# Patient Record
Sex: Female | Born: 1985 | Race: White | Hispanic: No | Marital: Single | Smoking: Never smoker
Health system: Southern US, Community
[De-identification: ages and names within clinical notes are randomized; demographics above are authoritative.]

## PROBLEM LIST (undated history)

## (undated) DIAGNOSIS — O24419 Gestational diabetes mellitus in pregnancy, unspecified control: Secondary | ICD-10-CM

## (undated) DIAGNOSIS — I10 Essential (primary) hypertension: Secondary | ICD-10-CM

## (undated) HISTORY — PX: CHOLECYSTECTOMY: SHX55

## (undated) HISTORY — DX: Gestational diabetes mellitus in pregnancy, unspecified control: O24.419

## (undated) HISTORY — DX: Essential (primary) hypertension: I10

## (undated) HISTORY — PX: COLPOSCOPY: SHX161

---
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
## 2006-03-01 ENCOUNTER — Emergency Department (HOSPITAL_COMMUNITY): Admission: EM | Admit: 2006-03-01 | Discharge: 2006-03-01 | Payer: Self-pay | Admitting: Emergency Medicine

## 2006-12-10 ENCOUNTER — Inpatient Hospital Stay (HOSPITAL_COMMUNITY): Admission: AD | Admit: 2006-12-10 | Discharge: 2006-12-10 | Payer: Self-pay | Admitting: Obstetrics and Gynecology

## 2006-12-22 ENCOUNTER — Inpatient Hospital Stay (HOSPITAL_COMMUNITY): Admission: AD | Admit: 2006-12-22 | Discharge: 2006-12-22 | Payer: Self-pay | Admitting: Obstetrics and Gynecology

## 2007-02-23 ENCOUNTER — Inpatient Hospital Stay (HOSPITAL_COMMUNITY): Admission: AD | Admit: 2007-02-23 | Discharge: 2007-02-24 | Payer: Self-pay | Admitting: Obstetrics and Gynecology

## 2007-08-03 ENCOUNTER — Inpatient Hospital Stay (HOSPITAL_COMMUNITY): Admission: AD | Admit: 2007-08-03 | Discharge: 2007-08-06 | Payer: Self-pay | Admitting: Obstetrics and Gynecology

## 2007-10-25 ENCOUNTER — Emergency Department (HOSPITAL_COMMUNITY): Admission: EM | Admit: 2007-10-25 | Discharge: 2007-10-25 | Payer: Self-pay | Admitting: Family Medicine

## 2008-04-09 ENCOUNTER — Emergency Department
Admission: EM | Admit: 2008-04-09 | Disposition: A | Discharge status: Home / Self Care | Payer: Self-pay | Source: Ambulatory Visit

## 2008-06-07 ENCOUNTER — Observation Stay
Admission: EM | Admit: 2008-06-07 | Disposition: A | Discharge status: Home / Self Care | Payer: Self-pay | Source: Ambulatory Visit

## 2008-09-09 ENCOUNTER — Emergency Department
Admission: EM | Admit: 2008-09-09 | Disposition: A | Discharge status: Home / Self Care | Payer: Self-pay | Source: Ambulatory Visit

## 2008-11-27 ENCOUNTER — Ambulatory Visit
Admission: RE | Admit: 2008-11-27 | Disposition: A | Discharge status: Home / Self Care | Payer: Self-pay | Source: Ambulatory Visit

## 2010-09-30 ENCOUNTER — Emergency Department
Admission: EM | Admit: 2010-09-30 | Disposition: A | Discharge status: Home / Self Care | Payer: Self-pay | Source: Ambulatory Visit

## 2011-05-02 NOTE — H&P (Signed)
Emily [name], DOYLE                 ACCOUNT NO.:  [id number]   MEDICAL RECORD NO.:  [id number]          PATIENT TYPE:  MAT   LOCATION:  MATC                          FACILITY:  [facility]   PHYSICIAN:  Osborn Coho, M.D.   DATE OF BIRTH:  10-Feb-1986   DATE OF ADMISSION:  08/03/2007  DATE OF DISCHARGE:                              HISTORY & PHYSICAL   Ms. Schlatter is a 25 year old gravida 1, para 0, at 39-2/7 weeks, who  presented complaining of spontaneous rupture of membranes at 10 a.m.,  leaking clear fluid but no uterine contractions noted.  She reports  positive fetal movement.  Her group B strep culture is negative.  Pregnancy has been remarkable for:   1. Late to care at 17 weeks.  2. Family history of diabetes.  3. History of UTI, first trimester.  4. Rubella nonimmune.   PRENATAL LABS:  Blood type is O positive, Rh antibody.  VDRL  nonreactive.  Rubella titer is nonimmune.  Hepatitis B surface antigen  negative.  HIV nonreactive.  Sickle cell test was negative.  GC and  chlamydia cultures were negative in the first trimester.  Pap was  normal.  Cystic fibrosis testing was negative.  Hemoglobin  electrophoresis was normal.  Hemoglobin A1c was 4.8.  Hemoglobin upon  entering the practice was 12.  It was within normal limits at 27 weeks.  Glucola was normal at 113.  Quadruple screen was normal.  Group B strep  culture was negative at 36 weeks.  GC and chlamydia cultures were also  negative.   HISTORY OF PRESENT PREGNANCY:  The patient entered care at approximately  16-17 weeks.  Hemoglobin A1c was done as there was a very strong family  history of diabetes, both type 1 and type 2.  She had an ultrasound at  19 weeks showing normal growth and development with cervix of normal  length and anterior placenta.  Glucola was normal.  She had some severe  insomnia at 33 weeks and was given Ambien.  GC, chlamydia and group B  strep culture were done at 36 weeks, which were normal.  The  cervix was  2 cm in the office earlier this week.   OBSTETRICAL HISTORY:  The patient is a primigravida.   MEDICAL HISTORY:  She had her last Pap in 2006 prior to pregnancy and it  was normal.  She was a previous condom user.  She reports the usual  childhood illnesses.  She had a history of bladder infection in the past  and was given antibiotics at [facility] prior to her first visit.   She has no known medication allergies.   FAMILY HISTORY:  Maternal aunt, chronic hypertension.  Brother had  asthma.  Mother is a type 2 diabetic.  Her father and sister have type 1  diabetes.  Her sister had juvenile-onset diabetes and paternal  grandparents and maternal grandparents also had adult-onset diabetes.  Paternal aunt had some type of cancer.  Mother has depression and is on  medication.  Her father has depression and is on medication.   GENETIC HISTORY:  Remarkable for the father of the baby's paternal aunt  having six fingers and twins do run on the paternal side.   SOCIAL HISTORY:  The patient is single.  Father of the baby is involved  and supportive.  His name is Ned Grace.  The patient has an 11th  grade education.  She is employed at a [employer].  Her partner  has his GED and is going to community college.  He is employed as a  Curator.  She has been followed by the certified nurse midwife service  at [facility].  She denies any alcohol, drug or tobacco use  during this pregnancy.  She is biracial, white and Hispanic.   PHYSICAL EXAMINATION:  Vital signs are stable.  The patient is afebrile.  HEENT:  Within normal limits.  LUNGS:  Bilateral breath sounds are clear.  HEART:  Regular rate and rhythm without murmur.  BREASTS:  Soft and nontender.  ABDOMEN:  The fundal height is approximately 39 cm.  Estimated fetal  weight is 7-8 pounds.  Uterine contractions are very mild and  approximately every 5 minutes.  The patient is unaware of these at  present.  The  patient is noted to be leaking clear fluid.  Cervix is slightly  posterior, 2 cm, 75%, vertex at a -1 station.  Fetal heart rate is  reactive with no decelerations.  EXTREMITIES:  Deep tendon reflexes are 2+ without clonus.  There is a  trace edema noted.   IMPRESSION:  1. Intrauterine pregnancy at 39-2/7 weeks.  2. Early labor with spontaneous rupture of membranes and clear fluid.  3. Group B strep negative.   PLAN:  1. Admit to birthing suite per consult with Dr. Su Hilt as attending      physician.  2. Routine certified nurse midwife orders.  3. Will observe at present.  Will augment p.r.n. after 3-4 hours if no      advancement in labor.  4. Pain medications p.r.n.      Renaldo Reel Emilee Hero, C.N.M.      Osborn Coho, M.D.  Electronically Signed    VLL/MEDQ  D:  08/03/2007  T:  08/03/2007  Job:  [id number]

## 2011-09-09 ENCOUNTER — Emergency Department
Admission: EM | Admit: 2011-09-09 | Disposition: A | Discharge status: Home / Self Care | Payer: Self-pay | Source: Ambulatory Visit

## 2011-09-26 LAB — POCT RAPID STREP A: Streptococcus, Group A Screen (Direct): NEGATIVE

## 2011-09-29 LAB — CBC
HCT: 24 — ABNORMAL LOW
MCV: 80.4
Platelets: 347
RBC: 2.98 — ABNORMAL LOW
RBC: 3.98
WBC: 12.7 — ABNORMAL HIGH
WBC: 14.6 — ABNORMAL HIGH

## 2018-05-26 ENCOUNTER — Emergency Department
Admission: EM | Admit: 2018-05-26 | Discharge: 2018-05-26 | Disposition: A | Discharge status: Home / Self Care | Payer: Medicaid Other | Attending: Emergency Medicine | Admitting: Emergency Medicine

## 2018-05-26 ENCOUNTER — Emergency Department: Payer: Medicaid Other

## 2018-05-26 DIAGNOSIS — K529 Noninfective gastroenteritis and colitis, unspecified: Secondary | ICD-10-CM | POA: Insufficient documentation

## 2018-05-26 LAB — CBC AND DIFFERENTIAL
Basophils %: 0.7 % (ref 0.0–3.0)
Basophils Absolute: 0.1 10*3/uL (ref 0.0–0.3)
Eosinophils %: 1.3 % (ref 0.0–7.0)
Eosinophils Absolute: 0.2 10*3/uL (ref 0.0–0.8)
Hematocrit: 42.8 % (ref 36.0–48.0)
Hemoglobin: 14.4 gm/dL (ref 12.0–16.0)
Lymphocytes Absolute: 2 10*3/uL (ref 0.6–5.1)
Lymphocytes: 13.7 % — ABNORMAL LOW (ref 15.0–46.0)
MCH: 30 pg (ref 28–35)
MCHC: 34 gm/dL (ref 31–36)
MCV: 88 fL (ref 80–100)
MPV: 6 fL (ref 6.0–10.0)
Monocytes Absolute: 0.5 10*3/uL (ref 0.1–1.7)
Monocytes: 3.3 % (ref 3.0–15.0)
Neutrophils %: 80.9 % — ABNORMAL HIGH (ref 42.0–78.0)
Neutrophils Absolute: 11.6 10*3/uL — ABNORMAL HIGH (ref 1.7–8.6)
PLT CT: 288 10*3/uL (ref 130–440)
RBC: 4.84 10*6/uL (ref 3.80–5.00)
RDW: 11.5 % (ref 10.5–14.5)
WBC: 14.3 10*3/uL — ABNORMAL HIGH (ref 4.0–11.0)

## 2018-05-26 LAB — COMPREHENSIVE METABOLIC PANEL
ALT: 15 U/L (ref 0–55)
AST (SGOT): 17 U/L (ref 10–42)
Albumin/Globulin Ratio: 1.48 Ratio (ref 0.80–2.00)
Albumin: 4.5 gm/dL (ref 3.5–5.0)
Alkaline Phosphatase: 58 U/L (ref 40–145)
Anion Gap: 19.2 mMol/L — ABNORMAL HIGH (ref 7.0–18.0)
BUN / Creatinine Ratio: 13 Ratio (ref 10.0–30.0)
BUN: 10 mg/dL (ref 7–22)
Bilirubin, Total: 0.8 mg/dL (ref 0.1–1.2)
CO2: 21 mMol/L (ref 20.0–30.0)
Calcium: 9.4 mg/dL (ref 8.5–10.5)
Chloride: 108 mMol/L (ref 98–110)
Creatinine: 0.77 mg/dL (ref 0.60–1.20)
EGFR: 104 mL/min/{1.73_m2} (ref 60–150)
Globulin: 3.1 gm/dL (ref 2.0–4.0)
Glucose: 145 mg/dL — ABNORMAL HIGH (ref 71–99)
Osmolality Calc: 290 mOsm/kg (ref 275–300)
Potassium: 3.7 mMol/L (ref 3.5–5.3)
Protein, Total: 7.6 gm/dL (ref 6.0–8.3)
Sodium: 145 mMol/L (ref 136–147)

## 2018-05-26 LAB — LIPASE: Lipase: 9 U/L (ref 8–78)

## 2018-05-26 MED ORDER — VH HYDROMORPHONE HCL PF 1 MG/ML CARPUJECT
1.0000 mg | Freq: Once | INTRAMUSCULAR | Status: AC
Start: 2018-05-26 — End: 2018-05-26
  Administered 2018-05-26: 1 mg via INTRAVENOUS

## 2018-05-26 MED ORDER — ONDANSETRON HCL 4 MG PO TABS
4.0000 mg | ORAL_TABLET | Freq: Three times a day (TID) | ORAL | 0 refills | Status: DC | PRN
Start: 2018-05-26 — End: 2020-07-09

## 2018-05-26 MED ORDER — SODIUM CHLORIDE 0.9 % IJ SOLN
12.5000 mg | Freq: Once | INTRAMUSCULAR | Status: AC
Start: 2018-05-26 — End: 2018-05-26
  Administered 2018-05-26: 12.5 mg via INTRAVENOUS

## 2018-05-26 MED ORDER — HYDROCODONE-ACETAMINOPHEN 5-325 MG PO TABS
1.0000 | ORAL_TABLET | Freq: Four times a day (QID) | ORAL | 0 refills | Status: DC | PRN
Start: 2018-05-26 — End: 2020-07-09

## 2018-05-26 MED ORDER — SODIUM CHLORIDE 0.9 % IV BOLUS
1000.0000 mL | Freq: Once | INTRAVENOUS | Status: AC
Start: 2018-05-26 — End: 2018-05-26
  Administered 2018-05-26: 1000 mL via INTRAVENOUS

## 2018-05-26 MED ORDER — PROMETHAZINE HCL 25 MG/ML IJ SOLN
INTRAMUSCULAR | Status: AC
Start: 2018-05-26 — End: ?
  Filled 2018-05-26: qty 1

## 2018-05-26 MED ORDER — VH HYDROMORPHONE HCL 1 MG/ML (NARRATOR)
INTRAMUSCULAR | Status: AC
Start: 2018-05-26 — End: ?
  Filled 2018-05-26: qty 1

## 2018-05-26 MED ORDER — ONDANSETRON HCL 4 MG/2ML IJ SOLN
4.0000 mg | Freq: Once | INTRAMUSCULAR | Status: AC
Start: 2018-05-26 — End: 2018-05-26
  Administered 2018-05-26: 4 mg via INTRAVENOUS

## 2018-05-26 MED ORDER — IOHEXOL 350 MG/ML IV SOLN
100.0000 mL | Freq: Once | INTRAVENOUS | Status: AC | PRN
Start: 2018-05-26 — End: 2018-05-26
  Administered 2018-05-26: 100 mL via INTRAVENOUS

## 2018-05-26 NOTE — ED Notes (Signed)
Pt in CT.

## 2018-05-26 NOTE — EDIE (Signed)
COLLECTIVE?NOTIFICATION?05/26/2018 01:13?Stacy Powell, Stacy Powell?MRN: [id number]    [facility]'s patient encounter information:   XLK:?[id number]  Account [id number]  Billing Account [id number]      Criteria Met      3+Facilities in 90 Days    Security and Safety  No recent Security Events currently on file    ED Care Guidelines  There are currently no ED Care Guidelines for this patient. Please check your facility's medical records system.      Prescription Monitoring Program  140??- Narcotic Use Score  090??- Sedative Use Score  000??- Stimulant Use Score  300??- Overdose Risk Score  - All Scores range from 000-999 with 75% of the population scoring < 200 and on 1% scoring above 650  - The last digit of the narcotic, sedative, and stimulant score indicates the number of active prescriptions of that type  - Higher Use scores correlate with increased prescribers, pharmacies, mg equiv, and overlapping prescriptions  - Higher Overdose Risk Scores correlate with increased risk of unintentional overdose death   Concerning or unexpectedly high scores should prompt a review of the PMP record; this does not constitute checking PMP for prescribing purposes.      E.D. Visit Count (12 mo.)  Facility Visits   [facility] 2   [facility] 1   [facility] 1   Total 4   Note: Visits indicate total known visits.      Recent Emergency Department Visit Summary  Date Facility [facility] Type Diagnoses or Chief Complaint   May 26, 2018 [facility] [place] [place] [facility]      Diarrhea, vomiting, post surgery      May 14, 2018 [facility]. [place]. [place] [facility]     Mar 14, 2018 [facility] [place] [facility]      SORE THROAT      1. Acute pharyngitis, unspecified      2. Acute upper respiratory infection, unspecified      3. Personal history of nicotine dependence      Aug 21, 2017 [facility]  [place] [facility]      BACK PAIN      1. Low back pain      2. Strain of muscle, fascia and tendon of lower back, initial encounter      3. Exposure to other specified factors, initial encounter          Recent Inpatient Visit Summary  Date Facility [facility] Type Diagnoses or Chief Complaint   May 17, 2018 [facility]. [place]. [place] [facility]         Care Providers  There are no care providers on record at this time.   Collective Portal  This patient has registered at the [facility] Emergency Department   For more information visit: [email or web address]     PLEASE NOTE:    1.   Any care recommendations and other clinical information are provided as guidelines or for historical purposes only, and providers should exercise their own clinical judgment when providing care.    2.   You may only use this information for purposes of treatment, payment or health care operations activities, and subject to the limitations of applicable Collective Policies.    3.   You should consult directly with the organization that provided a care guideline or other clinical history with any questions about additional  information or accuracy or completeness of information provided.    ? 2019 [employer] - [email or web address]

## 2018-05-26 NOTE — ED Provider Notes (Signed)
Physician/Midlevel provider first contact with patient: 05/26/18 [facility]         [facility]  EMERGENCY DEPARTMENT  History and Physical Exam       ________________________________________________________________________        Patient Name:  Stacy Powell, Stacy Powell   Age/Sex:  32 y.o.  /  female     Attending Physician:  Kathrynn Running, MD   MRN:  [id number]     PCP:  Marisa Sprinkles, MD   Room:  E8/ED8-A     Patent DOB:  04/20/1986   Encounter Date:  05/26/2018       ________________________________________________________________________      History of Presenting Illness     Chief complaint: Abdominal Pain    HPI/ROS is limited by: none  HPI/ROS given by: patient          Stacy Powell is a 32 y.o. female who presents to the ED with complaint of Abdominal Pain      HPI     Context: Patient complains of nausea vomiting abdominal pain along with diarrhea beginning earlier this evening. Patient is constant crampy with intermittent exacerbation.She denies eating where she could have picked up contaminated food.    Patient underwent colposcopy 2 days ago.    Provocative: None    Pallative Factors: None    Quality: Cramping    Region: Diffuse abdominal    Radiation: None    Severity: 10 out of 10    Temporal Factors: Several hours ago        Associated symptoms: No fevers or chills. Watery diarrhea.         Review of Systems        Review of Systems   Constitutional: Positive for chills.   Gastrointestinal: Positive for abdominal pain, diarrhea and vomiting.   All other systems reviewed and are negative.             Allergies & Medications     Pt has No Known Allergies.    Current/Home Medications    OXYCODONE-ACETAMINOPHEN (PERCOCET) 5-325 MG PER TABLET    Take 1 tablet by mouth every 4 (four) hours as needed for Pain            Past Medical History     Pt  has a past medical history of Gestational diabetes.           Past Surgical History     Pt  has a past surgical history that includes Colposcopy.          Family History     The family history is not on file.         Social History     Social History   Substance Use Topics   . Smoking status: Light Tobacco Smoker   . Smokeless tobacco: Never Used   . Alcohol use Yes                    Physical Exam     Blood pressure 106/67, pulse 83, temperature 98.6 F (37 C), resp. rate 22, height 1.524 m, weight 68.3 kg, SpO2 98 %.       Physical Exam   Constitutional: She is oriented to person, place, and time. She appears well-developed and well-nourished. She appears distressed.   Patient moaning and writhing in pain   HENT:   Head: Normocephalic and atraumatic.   Eyes: Pupils are equal, round, and reactive to light.   Neck: Normal range of  motion. Neck supple.   Cardiovascular: Tachycardia present.    Pulmonary/Chest: Effort normal and breath sounds normal. No respiratory distress.   Abdominal: Soft. She exhibits no distension and no mass. There is no tenderness. There is no rebound and no guarding.   Musculoskeletal: Normal range of motion.   Neurological: She is alert and oriented to person, place, and time.   Skin: Skin is warm and dry.   Nursing note and vitals reviewed.           Orders Placed       Orders Placed This Encounter   Procedures   . CT Abdomen Pelvis with IV Cont   . CBC   . CMP   . Urinalysis w Microscopic and Culture if Indicated   . HCG, Qualitative, Urine   . Lipase   . Saline lock IV         ED Medication Orders     Start Ordered     Status Ordering Provider    05/26/18 0226 05/26/18 0225  HYDROmorphone (DILAUDID) injection 1 mg  Once in ED     Route: Intravenous  Ordered Dose: 1 mg     Last MAR action:  Given Kathrynn Running    05/26/18 0219 05/26/18 0218  promethazine (PHENERGAN) IV injection 12.5 mg  Once in ED     Route: Intravenous  Ordered Dose: 12.5 mg     Last MAR action:  Given Kathrynn Running    05/26/18 0144 05/26/18 0143  HYDROmorphone (DILAUDID) injection 1 mg  Once in ED     Route: Intravenous  Ordered Dose: 1 mg     Last MAR  action:  Given Kathrynn Running    05/26/18 0144 05/26/18 0143  sodium chloride 0.9 % bolus 1,000 mL  Once in ED     Route: Intravenous  Ordered Dose: 1,000 mL     Last MAR action:  Stopped Kathrynn Running    05/26/18 0121 05/26/18 0120  ondansetron (ZOFRAN) injection 4 mg  Once in ED     Route: Intravenous  Ordered Dose: 4 mg     Last MAR action:  Given Kathrynn Running                 Diagnostic Results     Laboratory results reviewed by ED provider:    Results     Procedure Component Value Units Date/Time    Lipase [[id number]] Collected:  05/26/18 0025    Specimen:  Plasma Updated:  05/26/18 0211     Lipase 9 U/L     CMP [[id number]]  (Abnormal) Collected:  05/26/18 0025    Specimen:  Plasma Updated:  05/26/18 0211     Sodium 145 mMol/L      Potassium 3.7 mMol/L      Chloride 108 mMol/L      CO2 21.0 mMol/L      Calcium 9.4 mg/dL      Glucose 782 (H) mg/dL      Creatinine 9.56 mg/dL      BUN 10 mg/dL      Protein, Total 7.6 gm/dL      Albumin 4.5 gm/dL      Alkaline Phosphatase 58 U/L      ALT 15 U/L      AST (SGOT) 17 U/L      Bilirubin, Total 0.8 mg/dL      Albumin/Globulin Ratio 1.48 Ratio      Anion Gap 19.2 (H) mMol/L  BUN/Creatinine Ratio 13.0 Ratio      EGFR 104 mL/min/1.16m2      Osmolality Calc 290 mOsm/kg      Globulin 3.1 gm/dL     CBC [[id number]]  (Abnormal) Collected:  05/26/18 0025    Specimen:  Blood from Blood Updated:  05/26/18 0154     WBC 14.3 (H) K/cmm      RBC 4.84 M/cmm      Hemoglobin 14.4 gm/dL      Hematocrit 27.2 %      MCV 88 fL      MCH 30 pg      MCHC 34 gm/dL      RDW 53.6 %      PLT CT 288 K/cmm      MPV 6.0 fL      NEUTROPHIL % 80.9 (H) %      Lymphocytes 13.7 (L) %      Monocytes 3.3 %      Eosinophils % 1.3 %      Basophils % 0.7 %      Neutrophils Absolute 11.6 (H) K/cmm      Lymphocytes Absolute 2.0 K/cmm      Monocytes Absolute 0.5 K/cmm      Eosinophils Absolute 0.2 K/cmm      BASO Absolute 0.1 K/cmm           Radiologic study results reviewed by ED provider:    Ct  Abdomen Pelvis With Iv Cont    Result Date: 05/26/2018   No acute findings   ReadingStation:[id number]      Rendering Provider: Kathrynn Running, MD           Procedures / EKG       EKG (interpreted by ED physician):      Procedures           MDM:        MDM    CT of the abdomen and pelvis is unremarkable. I don't think this is related to the surgery that she had a few days ago. Differential diagnosis includes gastroenteritis diverticulitis or small bowel obstruction. CT was unremarkable. After antiemetics she was resting comfortably. She is no longer vomiting. She is tolerating by mouth. Also considered in the differential diagnosis was pyelonephritis and there is no evidence of infection.       Diagnosis / Disposition:     Final Impression  1. Gastroenteritis        Disposition  ED Disposition     ED Disposition Condition Date/Time Comment    Discharge  Sun May 26, 2018  3:57 AM Stacy Powell discharge to home/self care.    Condition at disposition: Stable          Follow up Provider(s):  [facility]  [address]  [place] [address]  [phone]            Prescriptions  New Prescriptions    HYDROCODONE-ACETAMINOPHEN (NORCO) 5-325 MG PER TABLET    Take 1-2 tablets by mouth every 6 (six) hours as needed for Pain    ONDANSETRON (ZOFRAN) 4 MG TABLET    Take 1 tablet (4 mg total) by mouth every 8 (eight) hours as needed for Nausea              ______________________________    This document is generated from an EMR system and may have additions and omissions that were not intended by the user.  Kathrynn Running, MD  05/26/18 [phone]

## 2018-05-26 NOTE — ED Notes (Signed)
MD in room with pt

## 2018-05-26 NOTE — ED Notes (Signed)
Pt denied nausea at this time.

## 2018-05-26 NOTE — ED Notes (Signed)
Pt back from CT

## 2018-07-28 ENCOUNTER — Emergency Department
Admission: EM | Admit: 2018-07-28 | Discharge: 2018-07-28 | Disposition: A | Discharge status: Home / Self Care | Payer: Medicaid Other | Attending: Sports Medicine" | Admitting: Sports Medicine"

## 2018-07-28 DIAGNOSIS — A5901 Trichomonal vulvovaginitis: Secondary | ICD-10-CM | POA: Insufficient documentation

## 2018-07-28 LAB — VH URINALYSIS WITH MICROSCOPIC
Bilirubin, UA: NEGATIVE mg/dL
Glucose, UA: NEGATIVE mg/dL
Ketones UA: NEGATIVE mg/dL
Nitrite, UA: NEGATIVE
Urine Specific Gravity: >=1.03 (ref 1.001–1.040)
Urobilinogen, UA: 0.2 mg/dL
pH, Urine: 6 pH (ref 5.0–8.0)

## 2018-07-28 LAB — URINE HCG QUALITATIVE: Urine HCG Qualitative: NEGATIVE

## 2018-07-28 MED ORDER — VH BIO-K PLUS PROBIOTIC 50 BIL CFU CAPSULE
50.0000 | DELAYED_RELEASE_CAPSULE | Freq: Every day | ORAL | 0 refills | Status: AC
Start: 2018-07-28 — End: 2018-08-18

## 2018-07-28 MED ORDER — LIDOCAINE HCL (PF) 1 % IJ SOLN
INTRAMUSCULAR | Status: AC
Start: 2018-07-28 — End: ?
  Filled 2018-07-28: qty 5

## 2018-07-28 MED ORDER — VH LIDOCAINE HCL 1 % LOCAL IJ SOLN (WRAP FOR SHORTAGE USE)
1.0000 g | Freq: Once | INTRAMUSCULAR | Status: AC
Start: 2018-07-28 — End: 2018-07-28
  Administered 2018-07-28: 1 g via INTRAMUSCULAR

## 2018-07-28 MED ORDER — CEFTRIAXONE SODIUM 1 G IJ SOLR
INTRAMUSCULAR | Status: AC
Start: 2018-07-28 — End: ?
  Filled 2018-07-28: qty 1000

## 2018-07-28 MED ORDER — METRONIDAZOLE 500 MG PO TABS
500.0000 mg | ORAL_TABLET | Freq: Two times a day (BID) | ORAL | 0 refills | Status: AC
Start: 2018-07-28 — End: 2018-08-04

## 2018-07-28 MED ORDER — DOXYCYCLINE MONOHYDRATE 100 MG PO CAPS
100.0000 mg | ORAL_CAPSULE | Freq: Two times a day (BID) | ORAL | 0 refills | Status: AC
Start: 2018-07-28 — End: 2018-08-07

## 2018-07-28 NOTE — EDIE (Signed)
COLLECTIVE?NOTIFICATION?07/28/2018 18:25?TEMECA, SOMMA N?MRN: [id number]    [facility]'s patient encounter information:   NWG:?[id number]  Account [id number]  Billing Account [id number]      Criteria Met      3+Facilities in 90 Days    Security and Safety  No recent Security Events currently on file    ED Care Guidelines  There are currently no ED Care Guidelines for this patient. Please check your facility's medical records system.      Prescription Monitoring Program  110??- Narcotic Use Score  070??- Sedative Use Score  000??- Stimulant Use Score  290??- Overdose Risk Score  - All Scores range from 000-999 with 75% of the population scoring < 200 and on 1% scoring above 650  - The last digit of the narcotic, sedative, and stimulant score indicates the number of active prescriptions of that type  - Higher Use scores correlate with increased prescribers, pharmacies, mg equiv, and overlapping prescriptions  - Higher Overdose Risk Scores correlate with increased risk of unintentional overdose death   Concerning or unexpectedly high scores should prompt a review of the PMP record; this does not constitute checking PMP for prescribing purposes.      E.D. Visit Count (12 mo.)  Facility Visits   [facility] 3   [facility] 1   [facility] 2   Total 6   Note: Visits indicate total known visits.      Recent Emergency Department Visit Summary  Date Facility [facility] Type Diagnoses or Chief Complaint   Jul 28, 2018 [facility] [place] [place] [facility]      pelvic pressure; vaginal burning/itching      Jun 06, 2018 [facility] [place] [facility]      ABDOMINAL PAIN, VOMITING      VOMITING      Nausea with vomiting, unspecified      Urinary tract infection, site not specified      1. Unspecified abdominal pain      4. Personal history of nicotine dependence      May 26, 2018 [facility] [place] [place] [facility]      Diarrhea, vomiting, post surgery      Abdominal Pain      Noninfective gastroenteritis and colitis, unspecified      May 14, 2018 [facility]. [place]. [place] [facility]     Mar 14, 2018 [facility] [place] [facility]      SORE THROAT      1. Acute pharyngitis, unspecified      2. Acute upper respiratory infection, unspecified      3. Personal history of nicotine dependence      Aug 21, 2017 [facility] [place] [place] [facility]      BACK PAIN      1. Low back pain      2. Strain of muscle, fascia and tendon of lower back, initial encounter      3. Exposure to other specified factors, initial encounter          Recent Inpatient Visit Summary  Date Facility [facility] Type Diagnoses or Chief Complaint   May 17, 2018 [facility]. [place]. [place] [facility]         Care Team  There are no care providers on record at this time.   Collective Portal  This patient has registered at the  [facility] Emergency Department   For more information visit: [email or web address]     PLEASE NOTE:    1.   Any care recommendations and other clinical information are provided as guidelines or for historical purposes only, and providers should exercise their own clinical judgment when providing care.    2.   You may only use this information for purposes of treatment, payment or health care operations activities, and subject to the limitations of applicable [employer]ive Policies.    3.   You should consult directly with the organization that provided a care guideline or other clinical history with any questions about additional information or accuracy or completeness of information provided.    ? 2019 [place] - [email or web address]

## 2018-07-28 NOTE — Discharge Instructions (Signed)
What Are Sexually Transmitted Diseases (STDs)?  A sexually transmitted disease (STD) is a disease that is spread during sex. (An STD can also be called STI for sexually transmitted infection.) You can become infected with an STD if you have sex with someone who has an STD. Any sex that involves the penis, vagina, anus, or mouth can spread disease. Some STDs spread through body fluids such as semen, vaginal fluid, or blood. Others spread through contact with affected skin.  Who is at risk?     Places on or in the body where STDs cause damage include reproductive organs, the rectum, and the mouth.   It doesn't matter if you're straight or gay, female or female, young or old. Any person who has sex can get an STD. Your risk increases if:   You have more than one partner. The more partners you have, the greater your risk.   Your partner has other partners. If your partner is exposed to an STD, you could be, too.   You or your partner have had sex with other people in the past. Either of you might be carrying an STD from an earlier partner.   You have an STD. The STD may cause sores or other health problems that increase your risk of new infections. Your risk will stay high unless you change the behaviors that put you at risk of the current infection.  Prevent future problems  Left untreated, certain STDs can lead to cancer or even death. Some can harm unborn babies whose mothers are infected. Others can cause you to not be able to have children (sterility) or can affect changes in behavior or your ability to think. You can prevent these problems with safer sex, regular checkups, and early treatment. Always use a latex condom when you have sex. Get tested if you're at risk. And get treated early if you have an STD.  Getting checked  The only sure way to know if you have an STD is to get checked by a healthcare provider. If you notice a change in how your body looks or feels, have it checked out. But keep in mind,  STDs don't always show symptoms. So if you're at risk of STDs, get checked regularly. If you find you have an STD, be sure your partner gets treatment, too. If not, his or her health is at risk. And left untreated, your partner could pass the STD back to you, or on to others.  Common symptoms  Be alert to any changes in your body and your partner's body. Symptoms may appear in or near the vagina, penis, rectum, mouth, or throat. They include:   Unusual discharge   Lumps, bumps, or rashes   Sores that may be painful, itchy, or painless   Itchy skin   Burning with urination   Pain in the pelvis, belly (abdomen), or rectum  Even if you don't have symptoms  You may have an STD, even if you don't have symptoms. If you think you are at risk, get checked. Go to a clinic or to your healthcare provider. If your partner has an STD, you need to be tested too, even if you feel fine.  Vaccines to prevent disease  Vaccines (also called immunizations) are available to prevent hepatitis A and hepatitis B. These are two kinds of STDs. There is also a vaccine to prevent HPV. This is a virus that can be passed from person to person through sexual contact. Ask your healthcare provider   whether any of these vaccines is right for you.   Date Last Reviewed: 10/19/2015   2000-2019 [employer], [place]. [address] [place], [place] [address]. All rights reserved. This information is not intended as a substitute for professional medical care. Always follow your healthcare professional's instructions.          Trichomonas Vaginal Infection (Trichomoniasis)    Trichomonasvaginal infection is often called "trich." It is caused by a parasite that is passed during sex. This makes trich a sexually transmitted disease (STD). Both men and women can get trich, but it is more common in women.  Most people who have trich don't have any symptoms at first. If symptoms do occur, they may take weeks or months to develop.  Symptoms in women  can include:   Thin discharge from the vaginathat may smell bad and be clear, white, gray, green, or yellow in color   Itching,burning, redness, or soreness in or around the vagina   Pain in the lower belly   Frequent urination or pain and burning during urination   Pain during sex  Symptoms in men are not very common.Men may have trich and pass it to women during sex without knowing they were ever infected.  Ivery Quale is most often treated with antibiotics. Without treatment, trich can increase the risk of more serious health problems such as:   Pelvic inflammatory disease (PID)   Preterm delivery (giving birth to a baby early if you're pregnant)   HIV and certain other sexually transmitted diseases (STDs)  Home care   Take the antibiotics you're prescribed exactly as directed. Finishall of the medicine, even if your symptoms go away.   Avoid drinking alcohol until you're done with your treatment.   Tell any partners you have sex with that you have trich.They will need be tested for trich and possibly treated as well.   Avoid having sex until you and any partners you have sex with are confirmed to no longer have trich.  Prevention  The only way to avoid getting trich or any other STD is to avoid having sex. If you choose to have sex, then take steps to lower your health risks:   Use condoms when having sex.   Limit the number of partners you have sex with.   Get tested regularly for STDs. Ask any partner you have sex with to do the same.   Don't have sex with anyone who has symptoms that may be due to an STD.  Follow-up care  Follow up with your healthcare provider, or as advised.Testing will likely be done to ensure that the infection has cleared.  When to seek medical advice  Call your healthcare provider right away if:   You have a fever of100.79F (38C)or higher, or as directed by your provider.   Your symptoms worsen, or they don't go away even after completing your treatment.   You have  new pain in the lower bellyor pelvic region.   You have side effects that bother you or a reaction to the medicine you're taking.   You or any partners you have sex with have new symptoms, such as arash, joint pain, or sores.  Date Last Reviewed: 09/17/2016   2000-2019 [employer], [employer]. [address], [place], [place] [address]. All rights reserved. This information is not intended as a substitute for professional medical care. Always follow your healthcare professional's instructions.          Vaginal Infection: Trichomoniasis  Whether or not he has symptoms, your partner will also need to be treated for trichomoniasis.   Trichomoniasis is often called "trich." It is caused by a parasite that is passed during sex. Men with trich often don't have any symptoms. So they don't know that they are infected. In women, it can take weeks or months before symptoms develop.  Symptoms of trichomoniasis   Foamy gray or yellow-green discharge   Foul odor   Intense vaginal itching, burning, redness, and swelling at opening of vagina   Pain during sex or urination   Bleeding after sex  Treating trichomoniasis  Trich is treated with antibiotics. Be sure that you:   Finish all of your medicine. This is true even if your symptoms go away.   Avoid alcohol until you're done with all your medicine.   Tell your partner so that he can seek treatment and be tested for other STDs.   Avoid sex until you and your partner are both done with treatment.  Why treatment matters  Untreated trich can lead to problems. These include:   Increased risk of preterm delivery if you are pregnant   An abnormal Pap test result   Possible increased risk of pelvic inflammatory disease (PID)   Date Last Reviewed: 02/16/2016   2000-2019 [employer]. [address] [place], [place] [address]. All rights reserved. This information is not intended as a substitute for professional medical care. Always follow your healthcare  professional's instructions.

## 2018-07-28 NOTE — ED Triage Notes (Signed)
Alert female presents to ED w/ c/o vaginal pain and irritation for 3 days. Pt describes the pain as a constant burning w/o radiation. Pt reports that she was on an abx for a UTI 2 weeks ago and is unsure if she has a yeast infection. Pt denies vaginal discharge, abdominal pain, or back pain.

## 2018-07-28 NOTE — ED Notes (Signed)
Pt provided w/ written and verbal discharge instructions regarding f/u, treatment, rx, and s/s to monitor for. Pt confirmed understanding of all provided instructions and left treatment area in NAD. Steady gait noted.

## 2018-07-28 NOTE — ED Provider Notes (Signed)
[facility] EMERGENCY DEPARTMENT History and Physical Exam      Patient Name: Stacy Powell, Stacy Powell  Encounter Date:  07/28/2018  Attending Physician: Gareth Morgan, MD  PCP: Marisa Sprinkles, MD  Patient DOB:  Apr 18, 1986  MRN:  [id number]  Room:  E2/ED2-A      History of Presenting Illness     Chief complaint: Vaginal Pain    HPI/ROS is limited by: none  HPI/ROS given by: patient    CONTEXT/ DURATION:     Stacy Powell is a 32 y.o. female who presents with a 2 week history of vaginal itching and burning. She relates that a little over 2 weeks ago she was treated for a UTI.  She also states that about a month ago she got gonorrhea from her boyfriend. She states she was treated with a single shot. She denies any vaginal discharge.   LOCATION:  She was having some suprapubic discomfort and vaginal discomfort.  SEVERITY: The symptoms are described as Moderate to severe.    QUALITY:    ASSOCIATED SIGNS/ SYMPTOMS:  She has had no fever or chills. She denies any nausea or vomiting. She does not believe she is pregnant. She has had some dyspareunia. She relates that she has had conization surgery 2 months ago.  EXACERBATING/ MITIGATING FACTORS:  Symptoms are Worse with intercourse.  She is visiting this area from [place] for 1 week and has a GYN physician back home.  The patient admits that she is just completed her menstrual cycle this past week.      Review of Systems     Review of Systems   Constitutional: Negative for chills and fever.   Gastrointestinal: Negative for nausea and vomiting.   Genitourinary: Positive for dysuria. Negative for flank pain and hematuria.     All other systems reviewed and all are negative.     Allergies     Pt has No Known Allergies.    Medications     No current facility-administered medications for this encounter.     Current Outpatient Prescriptions:   .  doxycycline (MONODOX) 100 MG capsule, Take 1 capsule (100 mg total) by mouth 2 (two) times daily for 10 days, Disp: 20 capsule, Rfl: 0  .   HYDROcodone-acetaminophen (NORCO) 5-325 MG per tablet, Take 1-2 tablets by mouth every 6 (six) hours as needed for Pain, Disp: 10 tablet, Rfl: 0  .  lactobacillus species (BIO-K PLUS) capsule, Take 1 capsule (50 Billion CFU total) by mouth daily for 21 days, Disp: 21 capsule, Rfl: 0  .  metroNIDAZOLE (FLAGYL) 500 MG tablet, Take 1 tablet (500 mg total) by mouth 2 (two) times daily for 7 days, Disp: 14 tablet, Rfl: 0  .  ondansetron (ZOFRAN) 4 MG tablet, Take 1 tablet (4 mg total) by mouth every 8 (eight) hours as needed for Nausea, Disp: 10 tablet, Rfl: 0  .  oxyCODONE-acetaminophen (PERCOCET) 5-325 MG per tablet, Take 1 tablet by mouth every 4 (four) hours as needed for Pain, Disp: , Rfl:      Past Medical History     Pt has a past medical history of Gestational diabetes.    Past Surgical History     Pt has a past surgical history that includes Colposcopy and Cholecystectomy.    Family History     The family history is not on file.    Social History     Pt reports that she has been smoking.  She has never used smokeless tobacco. She  reports that she drinks alcohol. She reports that she does not use drugs.    Physical Exam     Blood pressure (!) 130/96, pulse 92, temperature 98.5 F (36.9 C), temperature source Oral, resp. rate 18, height 1.524 m, weight 63.5 kg, last menstrual period 07/21/2018, SpO2 100 %.    GENERAL: The patient is well-developed, well-nourished,   female whom appears in mild discomfort and in no distress.   There is no evidence of respiratory distress. The patient ambulates without gait abnormality or difficulty.   NEURO/ PSYCH: Normal affect and mood. Patient is alert, and oriented to person place and circumstance.   HEENT:   Pupils are equal, round, and  Extra-ocular muscles are intact bilaterally.   NECK: Supple, free range of motion  LUNGS: Clear to auscultation bilaterally.    HEART: Regular rate and rhythm no murmurs gallops or rubs.  ABDOMEN: Soft, non-distended,  No tenderness noted.   No CVAT.  PELVIC EXAM: B.U.S. Normal.  Bi-manual exam: There is  no cervical motion tenderness.  There is no adnexal mass or tenderness.  Speculum exam: Mild clear vaginal discharge noted.  Cervical os closed. Very Faint blood noted on gloves however, no  bleeding visualized.  Cervix appears to be normal and without evidence of inflammation.  SKIN: Warm, dry, mucous membranes moist, normal turgor, no rash noted.  EXTREMITIES: No gross visible deformity , free range of motion.  No edema or cyanosis.      Orders Placed     Orders Placed This Encounter   Procedures   . Wet Prep Smear   . STD Amplified DNA Probe   . Urinalysis with Microscopic   . HCG, Qualitative, Urine       ED Medication Orders     Start Ordered     Status Ordering Provider    07/28/18 1911 07/28/18 1910  cefTRIAXone (ROCEPHIN) IM injection 1 g  Once     Route: Intramuscular  Ordered Dose: 1 g     Last MAR action:  Given Kaidynce Pfister CLARK          Diagnostic Results       The results of the diagnostic studies below have been reviewed by myself:    Labs  Results     Procedure Component Value Units Date/Time    Wet Prep Smear [[id number]] Collected:  07/28/18 1908    Specimen:  Vagina Updated:  07/28/18 1931    Narrative:       Specimen: Vaginal  Collected: 07/28/2018 19:08     Status: Final      Last Updated: 07/28/2018 19:31                Smear- Wet Prep (Final)      Moderate WBC's Seen      Trichomonas Present      No Yeast or Hyphae Seen      Clue Cells Present      Bacterial vaginosis is a clinical syndrome characterized by a shift in      the microbiota of the vagina from predominantly Lactobacillus spp. to a      mixed microbiota of Gardnerella vaginalis, Prevotella spp., Mobiluncus      spp., and Mycoplasma hominis.  The presence of clue cells is indicative      of bacterial vaginosis.          STD Amplified DNA Probe [[id number]] Collected:  07/28/18 1908    Specimen:  Vaginal Swab Updated:  07/28/18 1926  Narrative:        Specimen  source - Vaginal Swab    Urinalysis with Microscopic [[id number]]  (Abnormal) Collected:  07/28/18 1846    Specimen:  Urine, Random Updated:  07/28/18 1901     Color, UA Yellow     Clarity, UA Hazy (A)     Specific Gravity, UR >=1.030     pH, Urine 6.0 pH      Protein, UR Trace mg/dL      Glucose, UA Negative mg/dL      Ketones UA Negative mg/dL      Bilirubin, UA Negative mg/dL      Blood, UA Moderate (A) mg/dL      Nitrite, UA Negative     Urobilinogen, UA 0.2 mg/dL      Leukocyte Esterase, UA Small (A) Leu/uL      UR Micro Performed     WBC, UA 15-20 (A) /hpf      RBC, UA 20-30 (A) /hpf      Bacteria, UA Few (A) /hpf      Squam Epithel, UA 30-40 (A) /lpf     HCG, Qualitative, Urine [[id number]] Collected:  07/28/18 1846    Specimen:  Urine, Random Updated:  07/28/18 1857     human chorionic gonadotropin (hCG), UR, Qual. Negative            MDM / Critical Care     Differential diagnosis considered includes:  Yeast infection, Trichomonas vaginitis, STI, Urinary tract infection/cystitis.     Given the patient's history of recent GC infection and recurrent dyspareunia and vaginal complaints she was given Rocephin. GC/Chlamydia studies were sent. The patient is being empirically treated with doxycycline. Vaginal wet prep shows Trichomonas. The patient is also being treated with Flagyl. Because of the combined antibiotics I have decided to treat her with lactobacillus probiotic as well. She was encouraged to follow with her gynecologist next week when she gets back home from vacation.     Diagnosis / Disposition     Clinical Impression  1. Trichomonal vaginitis        Disposition  ED Disposition     ED Disposition Condition Date/Time Comment    Discharge  Sun Jul 28, 2018  8:47 PM Zigmund Gottron discharge to home/self care.    Condition at disposition: Stable          Your gynecologist     In 1 week  Return to the Emergency Department if symptoms worsen or if you have any questions whatsoever. Feel free to call at  anytime with questions about your diagnosis and/or discharge instructions etc.       Prescriptions  New Prescriptions    DOXYCYCLINE (MONODOX) 100 MG CAPSULE    Take 1 capsule (100 mg total) by mouth 2 (two) times daily for 10 days    LACTOBACILLUS SPECIES (BIO-K PLUS) CAPSULE    Take 1 capsule (50 Billion CFU total) by mouth daily for 21 days    METRONIDAZOLE (FLAGYL) 500 MG TABLET    Take 1 tablet (500 mg total) by mouth 2 (two) times daily for 7 days       Note:  This chart was generated by the Epic EMR system/ speech recognition and may contain inherent errors, including typographical, or omissions not intended by the user       Gareth Morgan, MD  07/28/18 2048

## 2018-07-31 LAB — VH STD AMPLIFIED DNA PROBE
Chlamydia trachomatis: NEGATIVE
Neisseria gonorrhoeae: NEGATIVE

## 2020-07-09 ENCOUNTER — Emergency Department
Admission: EM | Admit: 2020-07-09 | Discharge: 2020-07-09 | Disposition: A | Discharge status: Home / Self Care | Payer: Medicaid Other | Attending: Family Medicine | Admitting: Family Medicine

## 2020-07-09 DIAGNOSIS — N760 Acute vaginitis: Secondary | ICD-10-CM | POA: Insufficient documentation

## 2020-07-09 DIAGNOSIS — A599 Trichomoniasis, unspecified: Secondary | ICD-10-CM | POA: Insufficient documentation

## 2020-07-09 LAB — VH URINALYSIS WITH MICROSCOPIC AND CULTURE IF INDICATED
Bilirubin, UA: NEGATIVE mg/dL
Blood, UA: NEGATIVE mg/dL
Glucose, UA: NEGATIVE mg/dL
Ketones UA: NEGATIVE mg/dL
Nitrite, UA: NEGATIVE
Protein, UR: NEGATIVE mg/dL
RBC, UA: NONE SEEN /hpf (ref ?–23)
Urine Specific Gravity: 1.025 (ref 1.001–1.040)
Urobilinogen, UA: 0.2 mg/dL
pH, Urine: 5 pH (ref 5.0–8.0)

## 2020-07-09 LAB — VH SMEAR, WET PREP

## 2020-07-09 LAB — URINE HCG QUALITATIVE: Urine HCG Qualitative: NEGATIVE

## 2020-07-09 MED ORDER — CEFTRIAXONE SODIUM 1 G IJ SOLR
INTRAMUSCULAR | Status: AC
Start: 2020-07-09 — End: ?
  Filled 2020-07-09: qty 1000

## 2020-07-09 MED ORDER — LIDOCAINE HCL (PF) 1 % IJ SOLN
INTRAMUSCULAR | Status: AC
Start: 2020-07-09 — End: ?
  Filled 2020-07-09: qty 5

## 2020-07-09 MED ORDER — AZITHROMYCIN 250 MG PO TABS
ORAL_TABLET | ORAL | Status: AC
Start: 2020-07-09 — End: ?
  Filled 2020-07-09: qty 4

## 2020-07-09 MED ORDER — METRONIDAZOLE 0.75 % VA GEL
Freq: Two times a day (BID) | VAGINAL | 0 refills | Status: AC
Start: 2020-07-09 — End: ?

## 2020-07-09 MED ORDER — AZITHROMYCIN 250 MG PO TABS
1000.00 mg | ORAL_TABLET | Freq: Once | ORAL | Status: AC
Start: 2020-07-09 — End: 2020-07-09
  Administered 2020-07-09: 10:00:00 1000 mg via ORAL

## 2020-07-09 MED ORDER — CEFTRIAXONE SODIUM 1 G IJ SOLR
1.00 g | Freq: Once | INTRAMUSCULAR | Status: AC
Start: 2020-07-09 — End: 2020-07-09
  Administered 2020-07-09: 10:00:00 1 g via INTRAMUSCULAR

## 2020-07-09 NOTE — Discharge Instructions (Signed)
Bacterial Vaginosis    You have a vaginal infection called bacterial vaginosis (BV). Both good and bad bacteria are present in a healthy vagina. BV occurs when these bacteria get out of balance. The number of bad bacteria increase. And the number of good bacteria decrease. BV is linked with sexual activity, but it's not a sexually transmitted infection (STI).   BV may or may not cause symptoms. If symptoms do occur, they can include:    Thin, gray, milky-white, or sometimes green discharge   Unpleasant odor or "fishy" smell   Itching, burning, or pain in or around the vagina  It is not known what causes BV, but certain factors can make the problem more likely. These can include:    Douching   Spermicides   Use of antibiotics   Change in hormone levels with pregnancy, breastfeeding, or menopause   Having sex with a new partner   Having sex with more than one partner  BV will sometimes go away on its own. But treatment is often advised. This is because untreated BV can raise the risk of more serious health problems such as:    Pelvic inflammatory disease (PID)   Preterm delivery (giving birth to a baby early if you're pregnant)   HIV and some other sexually transmitted infections (STIs)   Infection after surgery on the reproductive organs  Home care  General care   BV is most often treated with medicines called antibiotics. These may be given as pills or as a vaginal cream.If antibiotics are prescribed, be sure to use them exactly as directed. And complete all of the medicine, even if your symptoms go away.   Don't douche or having sex during treatment.   If you have sex with a female partner, ask your healthcare provider if she should also be treated.  Prevention   Don't douche.   Don't have sex. If you do have sex, then take steps to lower your risk:  ? Use condoms when having sex.  ? Limit the number of sex partners you have.    Follow-up care  Follow up with your healthcare provider, or as  advised.   When to get medical advice  Call your healthcare provider right away if:    You have a fever of100.4F (38C)or higher, or as directed by your provider.   Your symptoms get worse, or they don't go away within a few days of starting treatment.   You have new pain in the lower bellyor pelvic region.   You have side effects that bother you or a reaction to the pills or cream you're prescribed.   You or any of your sex partners have new symptoms, such as a rash, joint pain, or sores.  [employer] last reviewed this educational content on 05/19/2019   2000-2021 [employer]. All rights reserved. This information is not intended as a substitute for professional medical care. Always follow your healthcare professional's instructions.

## 2020-07-09 NOTE — ED Triage Notes (Signed)
2 day history of vaginal irritation-no Saxon

## 2020-07-09 NOTE — ED Provider Notes (Signed)
EMERGENCY DEPARTMENT HISTORY AND PHYSICAL EXAM    Date: 07/09/20  Patient Name: Stacy Powell  Attending Physician: Alger Simons, MD  Patient DOB:  [birth year]  MRN:  [id number]  Room:  E12/EDA12-A      History of Presenting Illness     Chief Complaint: Vaginal pain, tampon came out yesterday that had been present for a while     HPI/ROS is limited by: none  HPI/ROS given by: patient       Context: Stacy Powell is a 34 y.o. female who presents with having found a tampon in her vagina yesterday that had been in "for a while." After pulling it out, she has vaginal pain and burning. She denies any vaginal discharge or lesions.   Location: vagina  Severity: moderate  Duration: 1 day   Quality: burning and itching   Associated Signs/ Symptoms: vaginal pain, recent tampon removal after it had been in for weeks  Exacerbation/Mitigating factors: unknown       PMD: Pcp, Notonfile, MD    Past Medical History     Past Medical History:   Diagnosis Date    Gestational diabetes        Past Surgical History     Past Surgical History:   Procedure Laterality Date    CHOLECYSTECTOMY      COLPOSCOPY         Family History     History reviewed. No pertinent family history.    Social History     Social History     Socioeconomic History    Marital status: Single     Spouse name: Not on file    Number of children: Not on file    Years of education: Not on file    Highest education level: Not on file   Occupational History    Not on file   Tobacco Use    Smoking status: Light Tobacco Smoker    Smokeless tobacco: Never Used   Vaping Use    Vaping Use: Some days   Substance and Sexual Activity    Alcohol use: Yes    Drug use: No    Sexual activity: Not on file   Other Topics Concern    Not on file   Social History Narrative    Not on file     Social Determinants of Health     Financial Resource Strain:     Difficulty of Paying Living Expenses:    Food Insecurity:     Worried About Programme researcher, broadcasting/film/video in the  Last Year:     Barista in the Last Year:    Transportation Needs:     Freight forwarder (Medical):     Lack of Transportation (Non-Medical):    Physical Activity:     Days of Exercise per Week:     Minutes of Exercise per Session:    Stress:     Feeling of Stress :    Social Connections:     Frequency of Communication with Friends and Family:     Frequency of Social Gatherings with Friends and Family:     Attends Religious Services:     Active Member of Clubs or Organizations:     Attends Banker Meetings:     Marital Status:    Intimate Partner Violence:     Fear of Current or Ex-Partner:     Emotionally Abused:     Physically Abused:  Sexually Abused:        Allergies     No Known Allergies    Home Medications     Prior to Admission medications    Medication Sig Start Date End Date Taking? Authorizing Provider   HYDROcodone-acetaminophen (NORCO) 5-325 MG per tablet Take 1-2 tablets by mouth every 6 (six) hours as needed for Pain 05/26/18 07/09/20  Zehner, Anselm Pancoast, MD   ondansetron (ZOFRAN) 4 MG tablet Take 1 tablet (4 mg total) by mouth every 8 (eight) hours as needed for Nausea 05/26/18 07/09/20  Zehner, Anselm Pancoast, MD   oxyCODONE-acetaminophen (PERCOCET) 5-325 MG per tablet Take 1 tablet by mouth every 4 (four) hours as needed for Pain  07/09/20  [provider]       ED Medications Administered     ED Medication Orders (From admission, onward)    Start Ordered     Status Ordering Provider    07/09/20 0945 07/09/20 0935  cefTRIAXone (ROCEPHIN) IM injection 1 g  Once     Route: Intramuscular  Ordered Dose: 1 g     Last MAR action: Given Minette Brine II    07/09/20 0938 07/09/20 0937  azithromycin (ZITHROMAX) tablet 1,000 mg  Once in ED     Route: Oral  Ordered Dose: 1,000 mg     Last MAR action: Given Gen Clagg J II            Review of Systems     Review of Systems   Constitutional: Negative for chills and fever.   HENT: Negative for ear pain and sore throat.     Eyes: Negative for discharge and redness.   Respiratory: Negative for cough and shortness of breath.    Cardiovascular: Negative for chest pain.   Gastrointestinal: Negative for abdominal pain, diarrhea, nausea and vomiting.   Genitourinary: Negative for dysuria, flank pain and urgency.   Musculoskeletal: Negative for neck pain.   Skin: Negative for rash.   Neurological: Negative for seizures, loss of consciousness and headaches.   All other systems reviewed and are negative.        Physical Exam     Physical Exam  Vitals and nursing note reviewed. Exam conducted with a chaperone present.   Constitutional:       Appearance: Normal appearance.   HENT:      Head: Normocephalic and atraumatic.      Right Ear: External ear normal.      Left Ear: External ear normal.      Nose: Nose normal.      Mouth/Throat:      Mouth: Mucous membranes are moist.      Pharynx: Oropharynx is clear.   Eyes:      Extraocular Movements: Extraocular movements intact.      Pupils: Pupils are equal, round, and reactive to light.   Cardiovascular:      Rate and Rhythm: Normal rate and regular rhythm.      Pulses: Normal pulses.      Heart sounds: Normal heart sounds.   Pulmonary:      Effort: Pulmonary effort is normal.      Breath sounds: Normal breath sounds.   Abdominal:      General: Abdomen is flat. Bowel sounds are normal.      Tenderness: There is no abdominal tenderness.   Genitourinary:     General: Normal vulva.      Vagina: No vaginal discharge.      Comments: No vaginal  FB, lesions or discharge   Musculoskeletal:         General: Normal range of motion.      Cervical back: Normal range of motion.   Skin:     General: Skin is warm and dry.   Neurological:      General: No focal deficit present.      Mental Status: She is alert and oriented to person, place, and time.           Procedures     N/A    Diagnostic Study Results     EKG: N/A    Monitor: N/A    Laboratory results reviewed by ED provider:    Results     Procedure  Component Value Units Date/Time    Wet Prep Smear [[id number]] Collected: 07/09/20 0934    Specimen: Vagina Updated: 07/09/20 0949     Wet Prep --     Trichomonas Present  No Yeast or Hyphae Seen  No Clue Cells Seen  Occasional WBC's Seen      STD Amplified DNA Probe [[id number]] Collected: 07/09/20 0934    Specimen: Endocervical Swab Updated: 07/09/20 0940    Narrative:       Specimen source - Endocervical Swab    Urinalysis w Microscopic and Culture if Indicated [[id number]]  (Abnormal) Collected: 07/09/20 0906    Specimen: Urine, Random Updated: 07/09/20 0924     Color, UA Yellow     Clarity, UA Clear     Urine Specific Gravity 1.025     pH, Urine 5.0 pH      Protein, UR Negative mg/dL      Glucose, UA Negative mg/dL      Ketones UA Negative mg/dL      Bilirubin, UA Negative mg/dL      Blood, UA Negative mg/dL      Nitrite, UA Negative     Urobilinogen, UA 0.2 mg/dL      Leukocyte Esterase, UA Trace Leu/uL      UR Micro Performed     WBC, UA 5-10 /hpf      RBC, UA None Seen /hpf      Bacteria, UA Occasional /hpf      Squam Epithel, UA 5-10 /lpf     Narrative:      A Urine Culture has been ordered based upon the Positive UA results.    HCG, Qualitative, Urine [[id number]] Collected: 07/09/20 0906    Specimen: Urine, Random Updated: 07/09/20 0919     Urine HCG Qualitative Negative    Urine Culture [[id number]] Collected: 07/09/20 0906    Specimen: Urine, Random Updated: 07/09/20 4259          Radiologic study results reviewed by ED provider:    Radiology Results (24 Hour)     ** No results found for the last 24 hours. **      .    Rendering Provider: Minette Brine II, MD      VS     Patient Vitals for the past 24 hrs:   BP Temp Temp src Pulse Resp SpO2 Height Weight   07/09/20 0829 124/80 98.5 F (36.9 C) Oral 78 18 98 % 1.499 m 61.2 kg         Clinical Course in Emergency Department     Consults: N/A    Reevaluation: N/A     MDM:     Diagnosis and Disposition     Clinical Impression  1. Acute vaginitis    2.  Trichimoniasis        Disposition  ED Disposition     ED Disposition Condition Date/Time Comment    Discharge  Fri Jul 09, 2020  9:43 AM Stacy Powell discharge to home/self care.    Condition at disposition: Stable          Vital signs were reviewed at the time of disposition.  Patient Vitals for the past 24 hrs:   BP Temp Temp src Pulse Resp SpO2 Height Weight   07/09/20 0829 124/80 98.5 F (36.9 C) Oral 78 18 98 % 1.499 m 61.2 kg          Prescriptions  New Prescriptions    METRONIDAZOLE (METROGEL) 0.75 % VAGINAL GEL    Place vaginally 2 (two) times daily         Follow-up Information     Darlis Loan, MD.    Specialty: Obstetrics and Gynecology  Contact information:  [address] [place]  [facility]  [place] [address]  [phone]                       SIGNED BY: Minette Brine II, MD        This chart was generated by an EMR and may contain errors, including typographical, or omissions not intended by the user.             Alger Simons, MD  07/09/20 1000

## 2020-07-11 LAB — VH CULTURE, URINE: Culture Result: POSITIVE

## 2020-07-12 LAB — VH STD AMPLIFIED DNA PROBE
Chlamydia trachomatis: NEGATIVE
Neisseria gonorrhoeae: NEGATIVE

## 2020-08-04 HISTORY — PX: BREAST REDUCTION SURGERY: SHX8

## 2021-10-19 ENCOUNTER — Emergency Department (HOSPITAL_COMMUNITY): Payer: Medicaid Other

## 2021-10-19 ENCOUNTER — Other Ambulatory Visit (HOSPITAL_COMMUNITY): Payer: Self-pay

## 2021-10-19 ENCOUNTER — Encounter (HOSPITAL_COMMUNITY): Payer: Self-pay

## 2021-10-19 ENCOUNTER — Emergency Department (HOSPITAL_COMMUNITY)
Admission: EM | Admit: 2021-10-19 | Discharge: 2021-10-19 | Disposition: A | Discharge status: Home / Self Care | Payer: Medicaid Other | Attending: Emergency Medicine | Admitting: Emergency Medicine

## 2021-10-19 DIAGNOSIS — O219 Vomiting of pregnancy, unspecified: Secondary | ICD-10-CM | POA: Insufficient documentation

## 2021-10-19 DIAGNOSIS — R1084 Generalized abdominal pain: Secondary | ICD-10-CM | POA: Diagnosis not present

## 2021-10-19 DIAGNOSIS — Z3A01 Less than 8 weeks gestation of pregnancy: Secondary | ICD-10-CM | POA: Diagnosis not present

## 2021-10-19 DIAGNOSIS — R197 Diarrhea, unspecified: Secondary | ICD-10-CM | POA: Diagnosis not present

## 2021-10-19 DIAGNOSIS — R109 Unspecified abdominal pain: Secondary | ICD-10-CM

## 2021-10-19 DIAGNOSIS — O0289 Other abnormal products of conception: Secondary | ICD-10-CM | POA: Diagnosis not present

## 2021-10-19 DIAGNOSIS — Z20822 Contact with and (suspected) exposure to covid-19: Secondary | ICD-10-CM | POA: Diagnosis not present

## 2021-10-19 LAB — URINALYSIS, ROUTINE W REFLEX MICROSCOPIC
Bilirubin Urine: NEGATIVE
Glucose, UA: NEGATIVE mg/dL
Hgb urine dipstick: NEGATIVE
Ketones, ur: 80 mg/dL — AB
Leukocytes,Ua: NEGATIVE
Nitrite: NEGATIVE
Protein, ur: NEGATIVE mg/dL
Specific Gravity, Urine: 1.026 (ref 1.005–1.030)
pH: 5 (ref 5.0–8.0)

## 2021-10-19 LAB — CBC WITH DIFFERENTIAL/PLATELET
Abs Immature Granulocytes: 0.05 10*3/uL (ref 0.00–0.07)
Basophils Absolute: 0 10*3/uL (ref 0.0–0.1)
Basophils Relative: 0 %
Eosinophils Absolute: 0.2 10*3/uL (ref 0.0–0.5)
Eosinophils Relative: 2 %
HCT: 39.7 % (ref 36.0–46.0)
Hemoglobin: 13.8 g/dL (ref 12.0–15.0)
Immature Granulocytes: 0 %
Lymphocytes Relative: 10 %
Lymphs Abs: 1.2 10*3/uL (ref 0.7–4.0)
MCH: 28.9 pg (ref 26.0–34.0)
MCHC: 34.8 g/dL (ref 30.0–36.0)
MCV: 83.2 fL (ref 80.0–100.0)
Monocytes Absolute: 0.6 10*3/uL (ref 0.1–1.0)
Monocytes Relative: 5 %
Neutro Abs: 9.9 10*3/uL — ABNORMAL HIGH (ref 1.7–7.7)
Neutrophils Relative %: 83 %
Platelets: 300 10*3/uL (ref 150–400)
RBC: 4.77 MIL/uL (ref 3.87–5.11)
RDW: 13.2 % (ref 11.5–15.5)
WBC: 12.1 10*3/uL — ABNORMAL HIGH (ref 4.0–10.5)
nRBC: 0 % (ref 0.0–0.2)

## 2021-10-19 LAB — LIPASE, BLOOD: Lipase: 23 U/L (ref 11–51)

## 2021-10-19 LAB — COMPREHENSIVE METABOLIC PANEL
ALT: 26 U/L (ref 0–44)
AST: 25 U/L (ref 15–41)
Albumin: 4.7 g/dL (ref 3.5–5.0)
Alkaline Phosphatase: 54 U/L (ref 38–126)
Anion gap: 8 (ref 5–15)
BUN: 11 mg/dL (ref 6–20)
CO2: 20 mmol/L — ABNORMAL LOW (ref 22–32)
Calcium: 8.9 mg/dL (ref 8.9–10.3)
Chloride: 106 mmol/L (ref 98–111)
Creatinine, Ser: 0.49 mg/dL (ref 0.44–1.00)
GFR, Estimated: >60 mL/min (ref >60)
Glucose, Bld: 150 mg/dL — ABNORMAL HIGH (ref 70–99)
Potassium: 3.5 mmol/L (ref 3.5–5.1)
Sodium: 134 mmol/L — ABNORMAL LOW (ref 135–145)
Total Bilirubin: 1.2 mg/dL (ref 0.3–1.2)
Total Protein: 8.1 g/dL (ref 6.5–8.1)

## 2021-10-19 LAB — I-STAT BETA HCG BLOOD, ED (MC, WL, AP ONLY): I-stat hCG, quantitative: >2000 m[IU]/mL — ABNORMAL HIGH (ref <5)

## 2021-10-19 LAB — HCG, SERUM, QUALITATIVE: Preg, Serum: POSITIVE — AB

## 2021-10-19 LAB — RESP PANEL BY RT-PCR (FLU A&B, COVID) ARPGX2
Influenza A by PCR: NEGATIVE
Influenza B by PCR: NEGATIVE
SARS Coronavirus 2 by RT PCR: NEGATIVE

## 2021-10-19 MED ORDER — ONDANSETRON 4 MG PO TBDP
4.0000 mg | ORAL_TABLET | Freq: Three times a day (TID) | ORAL | 0 refills | Status: DC | PRN
  Filled 2021-10-19: qty 20, 7d supply, fill #0

## 2021-10-19 MED ORDER — ONDANSETRON HCL 4 MG/2ML IJ SOLN
4.0000 mg | Freq: Once | INTRAMUSCULAR | Status: AC
  Administered 2021-10-19: 4 mg via INTRAVENOUS
  Filled 2021-10-19: qty 2

## 2021-10-19 MED ORDER — DEXTROSE-NACL 5-0.45 % IV SOLN: INTRAVENOUS | Status: DC

## 2021-10-19 NOTE — ED Provider Notes (Signed)
[facility]-EMERGENCY DEPT Provider Note   CSN: [id number] Arrival date & time: 10/19/21  0809     History Chief Complaint  Patient presents with   Abdominal Pain    Emily [name] is a 35 y.o. female.  HPI Patient presents with abdominal pain, nausea, vomiting.  Last menstrual period September, she had positive pregnancy test yesterday.  She does have 1 prior pregnancy, and is accompanied by her 106 year old child today. She recently moved to this area from [place].  She states that she was well until the past day or so when she developed diffuse abdominal pain, possibly worse in the suprapubic region.  No dysuria, no bleeding.  She has had diarrhea for the past few days, though this is stopped as of today. She has had no prenatal care yet as she is only recently pregnant. She states that she is otherwise generally well.  Today, with her abdominal pain, nausea, no relief with anything.    History reviewed. No pertinent past medical history.  There are no problems to display for this patient.   History reviewed. No pertinent surgical history.   OB History   No obstetric history on file.     History reviewed. No pertinent family history.  Social History   Tobacco Use   Smoking status: Unknown  Substance Use Topics   Alcohol use: Never   Drug use: Never    Home Medications Prior to Admission medications   Medication Sig Start Date End Date Taking? Authorizing Provider  acetaminophen (TYLENOL) 500 MG tablet Take 1,000 mg by mouth every 6 (six) hours as needed for mild pain.   Yes [provider]  Multiple Vitamins-Minerals (HAIR SKIN AND NAILS FORMULA PO) Take 1 tablet by mouth daily.   Yes [provider]  ondansetron (ZOFRAN ODT) 4 MG disintegrating tablet Take 1 tablet (4 mg total) by mouth every 8 (eight) hours as needed for nausea or vomiting. 10/19/21  Yes Gerhard Munch, MD    Allergies    Patient has no known  allergies.  Review of Systems   Review of Systems  Constitutional:        Per HPI, otherwise negative  HENT:         Per HPI, otherwise negative  Respiratory:         Per HPI, otherwise negative  Cardiovascular:        Per HPI, otherwise negative  Gastrointestinal:  Positive for abdominal pain, nausea and vomiting.  Endocrine:       Negative aside from HPI  Genitourinary:        Neg aside from HPI   Musculoskeletal:        Per HPI, otherwise negative  Skin: Negative.   Neurological:  Negative for syncope.   Physical Exam Updated Vital Signs BP (!) 136/93   Pulse 87   Temp 98.7 F (37.1 C) (Oral)   Resp 16   Ht 5' (1.524 m)   Wt 63.5 kg   LMP 09/01/2021 (Approximate)   SpO2 100%   BMI 27.34 kg/m   Physical Exam Vitals and nursing note reviewed.  Constitutional:      Appearance: She is well-developed.     Comments: Uncomfortable appearing adult female awake and alert, retching.  HENT:     Head: Normocephalic and atraumatic.  Eyes:     Conjunctiva/sclera: Conjunctivae normal.  Cardiovascular:     Rate and Rhythm: Regular rhythm. Tachycardia present.  Pulmonary:     Effort: Pulmonary effort  is normal. No respiratory distress.     Breath sounds: Normal breath sounds. No stridor.  Abdominal:     General: There is no distension.     Tenderness: There is generalized abdominal tenderness. There is no guarding or rebound. Negative signs include Murphy's sign and McBurney's sign.  Skin:    General: Skin is warm and dry.  Neurological:     Mental Status: She is alert and oriented to person, place, and time.     Cranial Nerves: No cranial nerve deficit.    ED Results / Procedures / Treatments   Labs (all labs ordered are listed, but only abnormal results are displayed) Labs Reviewed  COMPREHENSIVE METABOLIC PANEL - Abnormal; Notable for the following components:      Result Value   Sodium 134 (*)    CO2 20 (*)    Glucose, Bld 150 (*)    All other components  within normal limits  CBC WITH DIFFERENTIAL/PLATELET - Abnormal; Notable for the following components:   WBC 12.1 (*)    Neutro Abs 9.9 (*)    All other components within normal limits  URINALYSIS, ROUTINE W REFLEX MICROSCOPIC - Abnormal; Notable for the following components:   Ketones, ur 80 (*)    All other components within normal limits  HCG, SERUM, QUALITATIVE - Abnormal; Notable for the following components:   Preg, Serum POSITIVE (*)    All other components within normal limits  I-STAT BETA HCG BLOOD, ED ([facility], [facility], [facility] ONLY) - Abnormal; Notable for the following components:   I-stat hCG, quantitative >2,000.0 (*)    All other components within normal limits  RESP PANEL BY RT-PCR (FLU A&B, COVID) ARPGX2  LIPASE, BLOOD    EKG None  Radiology US OB LESS THAN 14 WEEKS W/ OB TRANSVAGINAL AND DOPPLER  Result Date: 10/19/2021 CLINICAL DATA:  Evaluate fetal age EXAM: OBSTETRIC <14 WK [place] AND TRANSVAGINAL OB US DOPPLER ULTRASOUND OF OVARIES TECHNIQUE: Both transabdominal and transvaginal ultrasound examinations were performed for complete evaluation of the gestation as well as the maternal uterus, adnexal regions, and pelvic cul-de-sac. Transvaginal technique was performed to assess early pregnancy. Color and duplex Doppler ultrasound was utilized to evaluate blood flow to the ovaries. COMPARISON:  None. FINDINGS: Intrauterine gestational sac: Single Yolk sac:  Seen Embryo:  Seen Cardiac Activity: Seen Heart Rate: 177 bpm MSD: 11.6 mm   6 w   0 d CRL:   3.3 mm   6 w 0 d                  [place] EDC: 06/14/2022 Subchorionic hemorrhage:  None visualized. Maternal uterus/adnexae: There is 4.3 x 1.9 x 3 cm complex cyst the left adnexa. Small amount of free fluid is seen in cul-de-sac. Pulsed Doppler evaluation of both ovaries demonstrates normal appearing low-resistance arterial and venous waveforms. IMPRESSION: Single live intrauterine pregnancy seen. Sonographically estimated gestational age is 6 weeks.  Estimated date of delivery is 06/14/2022. There is 4.3 cm complex cyst in the left adnexa, possibly hemorrhagic functional cyst. Small amount of free fluid is seen in the pelvis suggesting recent rupture of ovarian cyst or follicle. Electronically Signed   By: Ernie Avena M.D.   On: 10/19/2021 11:29    Procedures Procedures   Medications Ordered in ED Medications  dextrose 5 %-0.45 % sodium chloride infusion ( Intravenous New Bag/Given 10/19/21 0939)  ondansetron (ZOFRAN) injection 4 mg (4 mg Intravenous Given 10/19/21 8938)    ED Course  I have  reviewed the triage vital signs and the nursing notes.  Pertinent labs & imaging results that were available during my care of the patient were reviewed by me and considered in my medical decision making (see chart for details). Update: I discussed the patient's case with our sonographer team to ensure appropriate testing.  11:59 AM Now after fluid resuscitation patient is markedly better.  She is resting comfortably in bed.  Heart rate has diminished.  I reviewed her ultrasound, labs, discussed them with her.  Ultrasound reassuring, IUP visualized heart rate 177, and given her description last menstrual period about 6 weeks ago, consistent with pregnancy about that time. No evidence for ectopic no other acute findings, given improvement here, suspicion for hyperemesis gravidarum contributing to her presentation.  As patient recently relocated to this area she was provided follow-up instructions with our women's health clinic, discharged in stable condition. MDM Rules/Calculators/A&P MDM Number of Diagnoses or Management Options Abdominal pain: new, needed workup Generalized abdominal pain: new, needed workup Nausea and vomiting in pregnancy: new, needed workup   Amount and/or Complexity of Data Reviewed Clinical lab tests: ordered and reviewed Tests in the radiology section of CPT: ordered and reviewed Tests in the medicine section of  CPT: reviewed and ordered Decide to obtain previous medical records or to obtain history from someone other than the patient: yes Review and summarize past medical records: yes Independent visualization of images, tracings, or specimens: yes  Risk of Complications, Morbidity, and/or Mortality Presenting problems: high Diagnostic procedures: high Management options: high  Critical Care Total time providing critical care: < 30 minutes  Patient Progress Patient progress: improved   Final Clinical Impression(s) / ED Diagnoses Final diagnoses:  Nausea and vomiting in pregnancy  Generalized abdominal pain    Rx / DC Orders ED Discharge Orders          Ordered    ondansetron (ZOFRAN ODT) 4 MG disintegrating tablet  Every 8 hours PRN        10/19/21 1158             Gerhard Munch, MD 10/19/21 1201

## 2021-10-19 NOTE — ED Triage Notes (Signed)
Pt presents with c/o severe pelvic and abdominal pain. Pt is pregnant, unsure of exact LMP but reports it was around the middle of September. Pt denies any vaginal bleeding.

## 2021-10-19 NOTE — Discharge Instructions (Signed)
Please be sure to to arrange prenatal care at our women's health facility.  Return here for concerning changes in your condition.

## 2021-11-15 ENCOUNTER — Telehealth (INDEPENDENT_AMBULATORY_CARE_PROVIDER_SITE_OTHER): Payer: Medicaid Other

## 2021-11-15 DIAGNOSIS — O0990 Supervision of high risk pregnancy, unspecified, unspecified trimester: Secondary | ICD-10-CM | POA: Insufficient documentation

## 2021-11-15 DIAGNOSIS — Z3490 Encounter for supervision of normal pregnancy, unspecified, unspecified trimester: Secondary | ICD-10-CM | POA: Insufficient documentation

## 2021-11-15 NOTE — Patient Instructions (Signed)
  At our [facility], we work as an integrated team, providing care to address both physical and emotional health. Your medical provider may refer you to see our Behavioral Health Clinician (BHC) on the same day you see your medical provider, as availability permits; often scheduled virtually at your convenience.  Our BHC is available to all patients, visits generally last between 20-30 minutes, but can be longer or shorter, depending on patient need. The BHC offers help with stress management, coping with symptoms of depression and anxiety, major life changes , sleep issues, changing risky behavior, grief and loss, life stress, working on personal life goals, and  behavioral health issues, as these all affect your overall health and wellness.  The BHC is NOT available for the following: FMLA paperwork, court-ordered evaluations, specialty assessments (custody or disability), letters to employers, or obtaining certification for an emotional support animal. The BHC does not provide long-term therapy. You have the right to refuse integrated behavioral health services, or to reschedule to see the BHC at a later date.  Confidentiality exception: If it is suspected that a child or disabled adult is being abused or neglected, we are required by law to report that to either Child Protective Services or Adult Protective Services.  If you have a diagnosis of Bipolar affective disorder, Schizophrenia, or recurrent Major depressive disorder, we will recommend that you establish care with a psychiatrist, as these are lifelong, chronic conditions, and we want your overall emotional health and medications to be more closely monitored. If you anticipate needing extended maternity leave due to mental health issues postpartum, it it recommended you inform your medical provider, so we can put in a referral to a psychiatrist as soon as possible. The BHC is unable to recommend an extended maternity leave for mental  health issues. Your medical provider or BHC may refer you to a therapist for ongoing, traditional therapy, or to a psychiatrist, for medication management, if it would benefit your overall health. Depending on your insurance, you may have a copay or be charged a deductible, depending on your insurance, to see the BHC. If you are uninsured, it is recommended that you apply for financial assistance. (Forms may be requested at the front desk for in-person visits, via MyChart, or request a form during a virtual visit).  If you see the BHC more than 6 times, you will have to complete a comprehensive clinical assessment interview with the BHC to resume integrated services.  For virtual visits with the BHC, you must be physically in the state of [place] at the time of the visit. For example, if you live in [place], you will have to do an in-person visit with the BHC, and your out-of-state insurance may not cover behavioral health services in [place]. If you are going out of the state or country for any reason, the BHC may see you virtually when you return to [place], but not while you are physically outside of .    

## 2021-11-15 NOTE — Progress Notes (Signed)
Called pt. Pt states she was planning on virtual appt today and has access to MyChart. Direct link to visit sent to patient's phone number. Instructed pt to log into MyChart app or click link. Pt did not connect after 10 minutes. Called pt again, pt states she did not receive the link. Asked pt to log into MyChart. Pt states she has access and does have her password. Patient did not connect after 5 minutes. Called pt twice more 5 minutes apart; VM left x 2 with instructions to reschedule if unable to complete appt today. New OB appt time/date given.  Marjo Bicker, RN 11/15/2021  1:16 PM

## 2021-11-22 ENCOUNTER — Emergency Department (HOSPITAL_COMMUNITY)
Admission: EM | Admit: 2021-11-22 | Discharge: 2021-11-23 | Disposition: A | Discharge status: Home / Self Care | Payer: Medicaid Other | Attending: Emergency Medicine | Admitting: Emergency Medicine

## 2021-11-22 ENCOUNTER — Encounter (HOSPITAL_COMMUNITY): Payer: Self-pay | Admitting: Physician Assistant

## 2021-11-22 ENCOUNTER — Other Ambulatory Visit: Payer: Self-pay

## 2021-11-22 DIAGNOSIS — O99281 Endocrine, nutritional and metabolic diseases complicating pregnancy, first trimester: Secondary | ICD-10-CM | POA: Diagnosis not present

## 2021-11-22 DIAGNOSIS — Z3A01 Less than 8 weeks gestation of pregnancy: Secondary | ICD-10-CM | POA: Diagnosis not present

## 2021-11-22 DIAGNOSIS — D72829 Elevated white blood cell count, unspecified: Secondary | ICD-10-CM | POA: Diagnosis not present

## 2021-11-22 DIAGNOSIS — O219 Vomiting of pregnancy, unspecified: Secondary | ICD-10-CM | POA: Diagnosis present

## 2021-11-22 DIAGNOSIS — Z20822 Contact with and (suspected) exposure to covid-19: Secondary | ICD-10-CM | POA: Diagnosis not present

## 2021-11-22 DIAGNOSIS — O99011 Anemia complicating pregnancy, first trimester: Secondary | ICD-10-CM | POA: Diagnosis not present

## 2021-11-22 DIAGNOSIS — O99111 Other diseases of the blood and blood-forming organs and certain disorders involving the immune mechanism complicating pregnancy, first trimester: Secondary | ICD-10-CM | POA: Diagnosis not present

## 2021-11-22 LAB — COMPREHENSIVE METABOLIC PANEL
ALT: 16 U/L (ref 0–44)
AST: 17 U/L (ref 15–41)
Albumin: 3.9 g/dL (ref 3.5–5.0)
Alkaline Phosphatase: 40 U/L (ref 38–126)
Anion gap: 10 (ref 5–15)
BUN: 9 mg/dL (ref 6–20)
CO2: 22 mmol/L (ref 22–32)
Calcium: 9.4 mg/dL (ref 8.9–10.3)
Chloride: 104 mmol/L (ref 98–111)
Creatinine, Ser: 0.51 mg/dL (ref 0.44–1.00)
GFR, Estimated: >60 mL/min (ref >60)
Glucose, Bld: 102 mg/dL — ABNORMAL HIGH (ref 70–99)
Potassium: 3.2 mmol/L — ABNORMAL LOW (ref 3.5–5.1)
Sodium: 136 mmol/L (ref 135–145)
Total Bilirubin: 0.3 mg/dL (ref 0.3–1.2)
Total Protein: 7.3 g/dL (ref 6.5–8.1)

## 2021-11-22 LAB — URINALYSIS, ROUTINE W REFLEX MICROSCOPIC
Bilirubin Urine: NEGATIVE
Glucose, UA: NEGATIVE mg/dL
Hgb urine dipstick: NEGATIVE
Ketones, ur: NEGATIVE mg/dL
Leukocytes,Ua: NEGATIVE
Nitrite: NEGATIVE
Protein, ur: NEGATIVE mg/dL
Specific Gravity, Urine: 1.01 (ref 1.005–1.030)
pH: 6 (ref 5.0–8.0)

## 2021-11-22 LAB — HCG, QUANTITATIVE, PREGNANCY: hCG, Beta Chain, Quant, S: 155817 m[IU]/mL — ABNORMAL HIGH (ref <5)

## 2021-11-22 LAB — MAGNESIUM: Magnesium: 2 mg/dL (ref 1.7–2.4)

## 2021-11-22 LAB — RESP PANEL BY RT-PCR (FLU A&B, COVID) ARPGX2
Influenza A by PCR: NEGATIVE
Influenza B by PCR: NEGATIVE
SARS Coronavirus 2 by RT PCR: NEGATIVE

## 2021-11-22 LAB — LIPASE, BLOOD: Lipase: 28 U/L (ref 11–51)

## 2021-11-22 MED ORDER — LACTATED RINGERS IV BOLUS: 1000.0000 mL | Freq: Once | INTRAVENOUS | Status: DC

## 2021-11-22 MED ORDER — POTASSIUM CHLORIDE CRYS ER 20 MEQ PO TBCR
40.0000 meq | EXTENDED_RELEASE_TABLET | Freq: Once | ORAL | Status: AC
  Administered 2021-11-22: 40 meq via ORAL
  Filled 2021-11-22: qty 2

## 2021-11-22 NOTE — ED Provider Notes (Signed)
Emergency Medicine Provider Triage Evaluation Note  Emily [name] , a 35 y.o. female  was evaluated in triage.  Pt complains of dizziness.  She has a lot of nausea and vomiting.  She is G3P2, She has a history of gestational diabetes.  She reports that her vomiting is better today but she has been feeling dizzy.  She denies any fevers.  She does report that she has a close household contact who has COVID.No spotting or bleeding  Review of Systems  Positive: N/V, dizziness Negative: syncope  Physical Exam  LMP 09/01/2021 (Approximate)  Gen:   Awake, Vomits once while I am in exam room.  Resp:  Normal effort  MSK:   Moves extremities without difficulty  Other:  Normal speech  Medical Decision Making  Medically screening exam initiated at 8:15 PM.  Appropriate orders placed.  Emily [name] was informed that the remainder of the evaluation will be completed by another provider, this initial triage assessment does not replace that evaluation, and the importance of remaining in the ED until their evaluation is complete.  Note: Portions of this report may have been transcribed using voice recognition software. Every effort was made to ensure accuracy; however, inadvertent computerized transcription errors may be present    Norman Clay 11/22/21 2034    Gerhard Munch, MD 11/22/21 2239

## 2021-11-22 NOTE — ED Provider Notes (Signed)
[facility]   CSN: [id number] Arrival date & time: 11/22/21  1956     History Chief Complaint  Patient presents with   Dehydration   Nausea    Emily [name] is a 35 y.o. female who is currently approximately [redacted] weeks pregnant presents to the ED with complaints of N/V and concern for dehydration today. Patient reports she has been having problems with morning sickness with her pregnancy, fairly daily multiple episodes of emesis in the AM, taking zofran as prescribed at last ED visit without relief. Today became nauseated and lightheaded shortly PTA, subsequently threw up and decided to come to the ED. She currently feels improved, but wanted to make sure that she is not dehydrated. She is scheduled to establish with OB 11/29/21. She denies fever, hematemesis, abdominal pain, diarrhea, dysuria, vaginal bleeding, chest pain, dyspnea or syncope.   HPI     History reviewed. No pertinent past medical history.  Patient Active Problem List   Diagnosis Date Noted   Supervision of low-risk pregnancy, unspecified trimester 11/15/2021    History reviewed. No pertinent surgical history.   OB History     Gravida  1   Para      Term      Preterm      AB      Living         SAB      IAB      Ectopic      Multiple      Live Births              History reviewed. No pertinent family history.  Social History   Tobacco Use   Smoking status: Unknown  Substance Use Topics   Alcohol use: Never   Drug use: Never    Home Medications Prior to Admission medications   Medication Sig Start Date End Date Taking? Authorizing Provider  acetaminophen (TYLENOL) 500 MG tablet Take 1,000 mg by mouth every 6 (six) hours as needed for mild pain.    [provider]  Multiple Vitamins-Minerals (HAIR SKIN AND NAILS FORMULA PO) Take 1 tablet by mouth daily.    [provider]  ondansetron (ZOFRAN ODT) 4 MG  disintegrating tablet Dissolve 1 tablet (4 mg total) by mouth every 8 hours as needed for nausea or vomiting. 10/19/21   Gerhard Munch, MD    Allergies    Patient has no known allergies.  Review of Systems   Review of Systems  Constitutional:  Negative for chills and fever.  Respiratory:  Negative for shortness of breath.   Cardiovascular:  Negative for chest pain.  Gastrointestinal:  Positive for nausea and vomiting. Negative for abdominal pain, blood in stool, constipation and diarrhea.  Genitourinary:  Negative for dysuria, vaginal bleeding and vaginal discharge.  Neurological:  Positive for light-headedness (resolved). Negative for syncope.  All other systems reviewed and are negative.  Physical Exam Updated Vital Signs BP 130/77 (BP Location: Right Arm)   Pulse 92   Temp 98.1 F (36.7 C) (Oral)   Resp 16   LMP 09/01/2021 (Approximate)   SpO2 100%   Physical Exam Vitals and nursing note reviewed.  Constitutional:      General: She is not in acute distress.    Appearance: She is well-developed. She is not toxic-appearing.  HENT:     Head: Normocephalic and atraumatic.  Eyes:     General:        Right eye: No  discharge.        Left eye: No discharge.     Conjunctiva/sclera: Conjunctivae normal.  Cardiovascular:     Rate and Rhythm: Normal rate and regular rhythm.  Pulmonary:     Effort: Pulmonary effort is normal. No respiratory distress.     Breath sounds: Normal breath sounds. No wheezing, rhonchi or rales.  Abdominal:     General: There is no distension.     Palpations: Abdomen is soft.     Tenderness: There is no abdominal tenderness. There is no guarding or rebound.  Musculoskeletal:     Cervical back: Neck supple.  Skin:    General: Skin is warm and dry.     Findings: No rash.  Neurological:     Mental Status: She is alert.     Comments: Clear speech.   Psychiatric:        Behavior: Behavior normal.    ED Results / Procedures / Treatments    Labs (all labs ordered are listed, but only abnormal results are displayed) Labs Reviewed  HCG, QUANTITATIVE, PREGNANCY - Abnormal; Notable for the following components:      Result Value   hCG, Beta Chain, Quant, S 155,817 (*)    All other components within normal limits  COMPREHENSIVE METABOLIC PANEL - Abnormal; Notable for the following components:   Potassium 3.2 (*)    Glucose, Bld 102 (*)    All other components within normal limits  RESP PANEL BY RT-PCR (FLU A&B, COVID) ARPGX2  URINALYSIS, ROUTINE W REFLEX MICROSCOPIC  LIPASE, BLOOD  MAGNESIUM  CBC WITH DIFFERENTIAL/PLATELET  CBC WITH DIFFERENTIAL/PLATELET    EKG None  Radiology No results found.  Procedures Procedures   Medications Ordered in ED Medications  lactated ringers bolus 1,000 mL (has no administration in time range)  potassium chloride SA (KLOR-CON M) CR tablet 40 mEq (has no administration in time range)    ED Course  I have reviewed the triage vital signs and the nursing notes.  Pertinent labs & imaging results that were available during my care of the patient were reviewed by me and considered in my medical decision making (see chart for details).    MDM Rules/Calculators/A&P                           Patient presents to the ED with complaints of N/V in pregnancy. Nontoxic, vitals w/ initial elevated BP & HR- normalized.   Additional history obtained:  Additional history obtained from chart review & nursing note review.   10/19/21 OB US: Single live intrauterine pregnancy seen. Sonographically estimated gestational age is 6 weeks. Estimated date of delivery is 06/14/2022. There is 4.3 cm complex cyst in the left adnexa, possibly hemorrhagic functional cyst. Small amount of free fluid is seen in the pelvis suggesting recent rupture of ovarian cyst or follicle.  Lab Tests:  I reviewed and interpreted labs, which included:  CBC: mild anemia/leukocytosis.  CMP: mild hypokalemia- oral  replacement ordered in the ED.  Lipase: WNL Magnesium: WNL Covid/flu: Negative.  Quant: 007,622  ED Course:  Suspect N/V in pregnancy. Known IUP. No abdominal pain or vaginal bleeding. No dysuria- UA without infection to suggest UTI/pyelo.  Abdomen nontender w/o peritoneal signs. Labs overall reassuring, mild hypokalemia, no significant electrolyte derangement or renal dysfunction. Ordered IVFs patient declined, she is tolerating oral fluids & is ambulatory, will discharge with diclegis and plans for close OB follow up. I discussed results, treatment plan,  need for follow-up, and return precautions with the patient. Provided opportunity for questions, patient confirmed understanding and is in agreement with plan.   Portions of this note were generated with Scientist, clinical (histocompatibility and immunogenetics). Dictation errors may occur despite best attempts at proofreading.  Final Clinical Impression(s) / ED Diagnoses Final diagnoses:  Nausea and vomiting during pregnancy    Rx / DC Orders ED Discharge Orders          Ordered    potassium chloride SA (KLOR-CON M) 20 MEQ tablet  Daily        11/23/21 0042    Doxylamine-Pyridoxine 10-10 MG TBEC        11/23/21 0042             Juliahna Wiswell, Pleas Koch, PA-C 11/23/21 0044    Tilden Fossa, MD 11/23/21 [phone]

## 2021-11-22 NOTE — ED Triage Notes (Signed)
Pt states that for the past month she has been unable to keep fluids down. Pt reports being pregnant and already prescribed Zofran but it is not helping.

## 2021-11-23 LAB — CBC WITH DIFFERENTIAL/PLATELET
Abs Immature Granulocytes: 0.15 10*3/uL — ABNORMAL HIGH (ref 0.00–0.07)
Basophils Absolute: 0 10*3/uL (ref 0.0–0.1)
Basophils Relative: 0 %
Eosinophils Absolute: 0.1 10*3/uL (ref 0.0–0.5)
Eosinophils Relative: 1 %
HCT: 34.8 % — ABNORMAL LOW (ref 36.0–46.0)
Hemoglobin: 11.8 g/dL — ABNORMAL LOW (ref 12.0–15.0)
Immature Granulocytes: 1 %
Lymphocytes Relative: 14 %
Lymphs Abs: 1.6 10*3/uL (ref 0.7–4.0)
MCH: 29.1 pg (ref 26.0–34.0)
MCHC: 33.9 g/dL (ref 30.0–36.0)
MCV: 85.7 fL (ref 80.0–100.0)
Monocytes Absolute: 0.4 10*3/uL (ref 0.1–1.0)
Monocytes Relative: 4 %
Neutro Abs: 9.1 10*3/uL — ABNORMAL HIGH (ref 1.7–7.7)
Neutrophils Relative %: 80 %
Platelets: 269 10*3/uL (ref 150–400)
RBC: 4.06 MIL/uL (ref 3.87–5.11)
RDW: 13.6 % (ref 11.5–15.5)
WBC: 11.4 10*3/uL — ABNORMAL HIGH (ref 4.0–10.5)
nRBC: 0 % (ref 0.0–0.2)

## 2021-11-23 MED ORDER — POTASSIUM CHLORIDE CRYS ER 20 MEQ PO TBCR: 20.0000 meq | EXTENDED_RELEASE_TABLET | Freq: Every day | ORAL | 0 refills | Status: DC

## 2021-11-23 MED ORDER — DOXYLAMINE-PYRIDOXINE 10-10 MG PO TBEC: DELAYED_RELEASE_TABLET | ORAL | 0 refills | Status: DC

## 2021-11-23 NOTE — Discharge Instructions (Addendum)
You were seen in the Er today for nausea & vomiting.  Your labs showed your potassium was mildly low and that you are mildly anemic.  Please take the potassium supplement prescribed for the next few days.  We are sending you home with diclegis to help with nausea/vomiting.   We have prescribed you new medication(s) today. Discuss the medications prescribed today with your pharmacist as they can have adverse effects and interactions with your other medicines including over the counter and prescribed medications. Seek medical evaluation if you start to experience new or abnormal symptoms after taking one of these medicines, seek care immediately if you start to experience difficulty breathing, feeling of your throat closing, facial swelling, or rash as these could be indications of a more serious allergic reaction  Follow up with OB as soon as possible  Return to the ER for new or worsening symptoms including but not limited to new or worsening pain, inability to keep fluids down, fever, blood in vomit/stool, passing out, dizziness, or any other concerns.

## 2021-11-29 ENCOUNTER — Other Ambulatory Visit: Payer: Self-pay

## 2021-11-29 ENCOUNTER — Encounter: Payer: Self-pay | Admitting: *Deleted

## 2021-11-29 ENCOUNTER — Encounter: Payer: Self-pay | Admitting: Student

## 2021-11-29 ENCOUNTER — Ambulatory Visit (INDEPENDENT_AMBULATORY_CARE_PROVIDER_SITE_OTHER): Payer: Medicaid Other | Admitting: Student

## 2021-11-29 VITALS — BP 123/78 | HR 68 | Wt 142.4 lb

## 2021-11-29 DIAGNOSIS — Z3A11 11 weeks gestation of pregnancy: Secondary | ICD-10-CM

## 2021-11-29 DIAGNOSIS — Z3490 Encounter for supervision of normal pregnancy, unspecified, unspecified trimester: Secondary | ICD-10-CM

## 2021-11-29 DIAGNOSIS — Z9889 Other specified postprocedural states: Secondary | ICD-10-CM

## 2021-11-29 LAB — POCT URINALYSIS DIP (DEVICE)
Bilirubin Urine: NEGATIVE
Glucose, UA: NEGATIVE mg/dL
Hgb urine dipstick: NEGATIVE
Ketones, ur: NEGATIVE mg/dL
Leukocytes,Ua: NEGATIVE
Nitrite: NEGATIVE
Protein, ur: NEGATIVE mg/dL
Specific Gravity, Urine: >=1.03 (ref 1.005–1.030)
Urobilinogen, UA: 0.2 mg/dL (ref 0.0–1.0)
pH: 5.5 (ref 5.0–8.0)

## 2021-11-29 MED ORDER — FAMOTIDINE 20 MG PO TABS
20.0000 mg | ORAL_TABLET | Freq: Two times a day (BID) | ORAL | 1 refills | Status: DC
Start: 2021-11-29 — End: 2022-04-05

## 2021-11-29 MED ORDER — BLOOD PRESSURE KIT DEVI: 1.0000 | 0 refills | Status: DC

## 2021-11-29 MED ORDER — SERTRALINE HCL 25 MG PO TABS: 25.0000 mg | ORAL_TABLET | Freq: Every day | ORAL | 0 refills | Status: DC

## 2021-11-29 MED ORDER — ASPIRIN 81 MG PO CHEW: 81.0000 mg | CHEWABLE_TABLET | Freq: Every day | ORAL | 1 refills | Status: DC

## 2021-11-29 NOTE — Progress Notes (Signed)
°  Subjective:    Emily [name] is being seen today for her first obstetrical visit.  This is not a planned pregnancy. She is at [redacted]w[redacted]d gestation. Her obstetrical history is significant for  history of gestational diabetes with her 2nd son. She denies any c/section in the past. She has two sons aged 34 and 51 . Relationship with FOB: he is supportive.  Patient does not intend to breast feed. Pregnancy history fully reviewed.  Patient reports that is having increased anxiety because she is not working and has a lot of time to think. SHe also reports some nausea and loss of appetite. She has had 4 losses in the past 4 years and reports significant distress over this. She is interested in medicine and also seeing behavioral health. She has had a breast reduction and wants to know if she can breastfeed.   Review of Systems:   Review of Systems  Constitutional: Negative.   HENT: Negative.    Respiratory: Negative.    Cardiovascular: Negative.   Gastrointestinal: Negative.   Genitourinary: Negative.   Neurological: Negative.    Objective:     BP 123/78    Pulse 68    Wt 142 lb 6.4 oz (64.6 kg)    LMP 09/01/2021 (Approximate)    BMI 27.81 kg/m  Physical Exam Constitutional:      Appearance: Normal appearance.  Cardiovascular:     Rate and Rhythm: Normal rate.  Pulmonary:     Effort: Pulmonary effort is normal.  Abdominal:     General: Abdomen is flat.  Musculoskeletal:        General: Normal range of motion.  Skin:    General: Skin is warm.  Neurological:     Mental Status: She is alert.    Exam    Assessment:    Pregnancy: [id number] Patient Active Problem List   Diagnosis Date Noted   Supervision of low-risk pregnancy, unspecified trimester 11/15/2021       Plan:     Initial labs drawn. Prenatal vitamins. Problem list reviewed and updated. AFP3 discussed: declined. Role of ultrasound in pregnancy discussed; fetal survey: ordered. Amniocentesis discussed: not  indicated. Follow up in 4 weeks. % of 45 min visit spent on counseling and coordination of care.  -declined genetic testing -started zoloft and baby ASA due to history of GDM and family history of DM -patient would like to see lactation ahead of time to talk about breastfeeding after breast reduction; will have patient schedule an upcoming appt with IBCLC  -referral placed to [facility] Instructions given on unisom and B6 and how to take [employer] Perri Lamagna 11/29/2021

## 2021-11-29 NOTE — Patient Instructions (Addendum)
-  buy any brand of over the counter aspirin; take 81 mg daily. Take with a prenatal vitamin.    Vitamin B6 -can reduce symptoms of mild to moderate nausea but usually does not help with  vomiting  Uinsom (Doxylamine)-is a medication that can reduce vomiting and may be combined with  Vitamin B6   1. For mild nausea without vomiting, Vitamin B6 (pyridoxine) 25mg  by mouth 3 times a day.  2. For moderate nausea with mild vomiting (two times a day or less) or if no relief from  Vitamin B6 alone, ADD Unisom 12.5mg  (0.5 tablet) by mouth at bedtime and continue  Vitamin B6   3. If no relief after 4-5 days, try Unisom 25mg  (1 tablet) by mouth at bedtime and 12.5mg   (1/2 tablet) in the morning and in the midafternoon along with Vitamin B6 three times a  day. May also increase Vitamin B6 to 50mg  at bedtime with 25mg  in morning and  midafternoon if needed.  Unisom can cause drowsiness. Start with bedtime dose for 4-5 days first to decrease  drowsiness then add as needed and as tolerated in morning and afternoon.  NOTE: Bedtime dose helps with morning nausea, morning dose helps with afternoon nausea,  and afternoon dose helps with evening nausea so adjust times for your particular needs.   Other Over the Counter Treatments: Ginger Capsules: 250mg  4 times a day or 8 oz of ginger tea 4 times a day  Acupressure wrist bands like Sea-Bands available at local pharmacies

## 2021-11-29 NOTE — Progress Notes (Signed)
Patient ultrasound scheduled for 01/18/22 at 2:45 pm. Patient notified.

## 2021-11-30 LAB — CBC/D/PLT+RPR+RH+ABO+RUBIGG...
Antibody Screen: NEGATIVE
Basophils Absolute: 0 10*3/uL (ref 0.0–0.2)
Basos: 0 %
EOS (ABSOLUTE): 0.2 10*3/uL (ref 0.0–0.4)
Eos: 2 %
HCV Ab: <0.1 s/co ratio (ref 0.0–0.9)
HIV Screen 4th Generation wRfx: NONREACTIVE
Hematocrit: 37.2 % (ref 34.0–46.6)
Hemoglobin: 12.3 g/dL (ref 11.1–15.9)
Hepatitis B Surface Ag: NEGATIVE
Immature Grans (Abs): 0 10*3/uL (ref 0.0–0.1)
Immature Granulocytes: 0 %
Lymphocytes Absolute: 1.9 10*3/uL (ref 0.7–3.1)
Lymphs: 21 %
MCH: 27.5 pg (ref 26.6–33.0)
MCHC: 33.1 g/dL (ref 31.5–35.7)
MCV: 83 fL (ref 79–97)
Monocytes Absolute: 0.4 10*3/uL (ref 0.1–0.9)
Monocytes: 4 %
Neutrophils Absolute: 6.8 10*3/uL (ref 1.4–7.0)
Neutrophils: 73 %
Platelets: 276 10*3/uL (ref 150–450)
RBC: 4.48 x10E6/uL (ref 3.77–5.28)
RDW: 13.3 % (ref 11.7–15.4)
RPR Ser Ql: NONREACTIVE
Rh Factor: POSITIVE
Rubella Antibodies, IGG: 1.09 index (ref >0.99)
WBC: 9.3 10*3/uL (ref 3.4–10.8)

## 2021-11-30 LAB — HCV INTERPRETATION

## 2021-11-30 LAB — HEMOGLOBIN A1C
Est. average glucose Bld gHb Est-mCnc: 105 mg/dL
Hgb A1c MFr Bld: 5.3 % (ref 4.8–5.6)

## 2021-12-01 LAB — CULTURE, OB URINE

## 2021-12-01 LAB — URINE CULTURE, OB REFLEX

## 2021-12-06 NOTE — BH Specialist Note (Deleted)
Integrated Behavioral Health via Telemedicine Visit  12/06/2021 Emily [name] [id number]  Number of Integrated Behavioral Health visits: 1 Session Start time: 9:15***  Session End time: 10:15*** Total time: {IBH Total Time:21014050}  Referring Provider: *** Patient/Family location: Home*** [facility] Provider location: [facility]  All persons participating in visit: Patient *** and [facility] Emily [name] ***  Types of Service: {CHL AMB TYPE OF SERVICE:[phone]}  I connected with Emily [name] and/or Emily [name]'s {family members:20773} via  Telephone or Video Enabled Telemedicine Application  (Video is Caregility application) and verified that I am speaking with the correct person using two identifiers. Discussed confidentiality: {YES/NO:21197}  I discussed the limitations of telemedicine and the availability of in person appointments.  Discussed there is a possibility of technology failure and discussed alternative modes of communication if that failure occurs.  I discussed that engaging in this telemedicine visit, they consent to the provision of behavioral healthcare and the services will be billed under their insurance.  Patient and/or legal guardian expressed understanding and consented to Telemedicine visit: {YES/NO:21197}  Presenting Concerns: Patient and/or family reports the following symptoms/concerns: *** Duration of problem: ***; Severity of problem: {Mild/Moderate/Severe:20260}  Patient and/or Family's Strengths/Protective Factors: {CHL AMB BH PROTECTIVE FACTORS:[phone]}  Goals Addressed: Patient will:  Reduce symptoms of: {IBH Symptoms:21014056}   Increase knowledge and/or ability of: {IBH Patient Tools:21014057}   Demonstrate ability to: {IBH Goals:21014053}  Progress towards Goals: {CHL AMB BH PROGRESS TOWARDS GOALS:[id number]}  Interventions: Interventions utilized:  {IBH  Interventions:21014054} Standardized Assessments completed: {IBH Screening Tools:21014051}  Patient and/or Family Response: ***  Assessment: Patient currently experiencing ***.   Patient may benefit from ***.  Plan: Follow up with behavioral health clinician on : *** Behavioral recommendations: *** Referral(s): {IBH Referrals:21014055}  I discussed the assessment and treatment plan with the patient and/or parent/guardian. They were provided an opportunity to ask questions and all were answered. They agreed with the plan and demonstrated an understanding of the instructions.   They were advised to call back or seek an in-person evaluation if the symptoms worsen or if the condition fails to improve as anticipated.  Valetta Close Cleburn Maiolo, LCSW

## 2021-12-18 NOTE — L&D Delivery Note (Signed)
Delivery Note Emily [name] is a 36 y.o. G3P2002 at [redacted]w[redacted]d admitted for IOL d/t poorly controlled A2GDM.   GBS Status:  Negative/-- (06/05 1528) Maximum Maternal Temperature: 99.4  Labor course: Initial SVE: 1.5/50/-3. Augmentation with: AROM, Pitocin, and OP Foley. She then progressed to complete.  ROM: 2h 98m with clear fluid  Birth: At 0912 a viable female was delivered via spontaneous vaginal delivery (Presentation: cephalic;LOA). Nuchal cord present: Yes, somersaulted and delivered through. Shoulders and body delivered in usual fashion. Infant placed directly on mom's abdomen for bonding/skin-to-skin, baby dried and stimulated. Cord clamped x 2 after 1 minute and cut by FOB-Stephen. Cord blood collected.  The placenta separated spontaneously and delivered via gentle cord traction.  Pitocin infused rapidly IV per protocol.  Fundus firm with massage. Brisk amount of bright red bleeding after delivery of placenta. Given multiparity, TXA given prophylactically Placenta inspected and appears to be intact with a 3 VC.  Placenta/Cord with the following complications: none.  Cord pH: n/a Sponge and instrument count were correct x2.  Intrapartum complications:  poorly controlled A2GDM Anesthesia:  epidural Episiotomy: none Lacerations:  1st degree Suture Repair: 3.0 vicryl EBL (mL): 325   Infant: APGAR (1 MIN): 8  APGAR (5 MINS): 9  Infant weight: pending   Mom to postpartum.  Baby to Couplet care / Skin to Skin. Placenta to L&D   Plans to Breastfeed Contraception: tubal ligation Circumcision: wants inpatient  Note sent to [facility]: [facility] for pp visit.   [place] 05/30/2022 9:44 AM

## 2021-12-22 NOTE — BH Specialist Note (Deleted)
Integrated Behavioral Health via Telemedicine Visit  12/22/2021 SAHIRA CATALDI [id number]  Number of Integrated Behavioral Health visits: 1 Session Start time: 1:15***  Session End time: 2:!5*** Total time: {IBH Total Time:21014050}  Referring Provider: *** Patient/Family location: Home*** [facility] Provider location: [facility]  All persons participating in visit: Patient *** and [facility] Paylin Hailu ***  Types of Service: {CHL AMB TYPE OF SERVICE:[phone]}  I connected with Loni Dolly and/or Daymon Larsen Israelson's {family members:20773} via  Telephone or Video Enabled Telemedicine Application  (Video is Caregility application) and verified that I am speaking with the correct person using two identifiers. Discussed confidentiality: {YES/NO:21197}  I discussed the limitations of telemedicine and the availability of in person appointments.  Discussed there is a possibility of technology failure and discussed alternative modes of communication if that failure occurs.  I discussed that engaging in this telemedicine visit, they consent to the provision of behavioral healthcare and the services will be billed under their insurance.  Patient and/or legal guardian expressed understanding and consented to Telemedicine visit: {YES/NO:21197}  Presenting Concerns: Patient and/or family reports the following symptoms/concerns: *** Duration of problem: ***; Severity of problem: {Mild/Moderate/Severe:20260}  Patient and/or Family's Strengths/Protective Factors: {CHL AMB BH PROTECTIVE FACTORS:[phone]}  Goals Addressed: Patient will:  Reduce symptoms of: {IBH Symptoms:21014056}   Increase knowledge and/or ability of: {IBH Patient Tools:21014057}   Demonstrate ability to: {IBH Goals:21014053}  Progress towards Goals: {CHL AMB BH PROGRESS TOWARDS GOALS:[phone]}  Interventions: Interventions utilized:  {IBH Interventions:21014054} Standardized  Assessments completed: {IBH Screening Tools:21014051}  Patient and/or Family Response: ***  Assessment: Patient currently experiencing ***.   Patient may benefit from ***.  Plan: Follow up with behavioral health clinician on : *** Behavioral recommendations: *** Referral(s): {IBH Referrals:21014055}  I discussed the assessment and treatment plan with the patient and/or parent/guardian. They were provided an opportunity to ask questions and all were answered. They agreed with the plan and demonstrated an understanding of the instructions.   They were advised to call back or seek an in-person evaluation if the symptoms worsen or if the condition fails to improve as anticipated.  Valetta Close Shalisha Clausing, LCSW

## 2021-12-28 ENCOUNTER — Encounter: Payer: Medicaid Other | Admitting: Student

## 2021-12-28 ENCOUNTER — Encounter: Payer: Medicaid Other | Admitting: Obstetrics and Gynecology

## 2022-01-10 ENCOUNTER — Telehealth: Payer: Self-pay

## 2022-01-10 NOTE — Telephone Encounter (Addendum)
Message received from [place] with front office: "hey I have this patient on the phone and she is wanting to know if theres a way she can get an ultrasound scheduled for her appt on thursday because she is concerned about the baby and wanted to speak with a nurse or doctor about it"  Returned call to patient. Reviewed [place] appt scheduled for 01/20/22. Explained we will check FHR at upcoming provider appt on 01/12/22. Pt states she is concerned about the baby because she is dehydrated. Offered FHR check nurse visit today, pt declines. Pt states she will follow up at appt on 01/12/22.  Reviewed return precautions for MAU.

## 2022-01-12 ENCOUNTER — Encounter: Payer: Medicaid Other | Admitting: Obstetrics and Gynecology

## 2022-01-18 ENCOUNTER — Other Ambulatory Visit: Payer: Medicaid Other

## 2022-01-20 ENCOUNTER — Ambulatory Visit: Payer: Medicaid Other | Attending: Student

## 2022-01-20 ENCOUNTER — Other Ambulatory Visit: Payer: Self-pay

## 2022-01-20 DIAGNOSIS — O4442 Low lying placenta NOS or without hemorrhage, second trimester: Secondary | ICD-10-CM | POA: Insufficient documentation

## 2022-01-20 DIAGNOSIS — Z3490 Encounter for supervision of normal pregnancy, unspecified, unspecified trimester: Secondary | ICD-10-CM

## 2022-01-20 DIAGNOSIS — Z363 Encounter for antenatal screening for malformations: Secondary | ICD-10-CM | POA: Diagnosis present

## 2022-01-20 DIAGNOSIS — Z3A19 19 weeks gestation of pregnancy: Secondary | ICD-10-CM | POA: Insufficient documentation

## 2022-01-21 ENCOUNTER — Emergency Department (HOSPITAL_COMMUNITY): Payer: Medicaid Other

## 2022-01-21 ENCOUNTER — Other Ambulatory Visit: Payer: Self-pay

## 2022-01-21 ENCOUNTER — Emergency Department (HOSPITAL_COMMUNITY)
Admission: EM | Admit: 2022-01-21 | Discharge: 2022-01-21 | Disposition: A | Discharge status: Home / Self Care | Payer: Medicaid Other | Attending: Emergency Medicine | Admitting: Emergency Medicine

## 2022-01-21 ENCOUNTER — Encounter (HOSPITAL_COMMUNITY): Payer: Self-pay

## 2022-01-21 DIAGNOSIS — Z3A19 19 weeks gestation of pregnancy: Secondary | ICD-10-CM | POA: Diagnosis not present

## 2022-01-21 DIAGNOSIS — Z7982 Long term (current) use of aspirin: Secondary | ICD-10-CM | POA: Diagnosis not present

## 2022-01-21 DIAGNOSIS — Y92410 Unspecified street and highway as the place of occurrence of the external cause: Secondary | ICD-10-CM | POA: Diagnosis not present

## 2022-01-21 DIAGNOSIS — O26892 Other specified pregnancy related conditions, second trimester: Secondary | ICD-10-CM | POA: Diagnosis not present

## 2022-01-21 DIAGNOSIS — S60051A Contusion of right little finger without damage to nail, initial encounter: Secondary | ICD-10-CM | POA: Diagnosis not present

## 2022-01-21 DIAGNOSIS — O9A212 Injury, poisoning and certain other consequences of external causes complicating pregnancy, second trimester: Secondary | ICD-10-CM | POA: Insufficient documentation

## 2022-01-21 DIAGNOSIS — S63619A Unspecified sprain of unspecified finger, initial encounter: Secondary | ICD-10-CM
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
## 2022-01-22 ENCOUNTER — Encounter: Payer: Self-pay | Admitting: Student

## 2022-01-22 DIAGNOSIS — Z8279 Family history of other congenital malformations, deformations and chromosomal abnormalities: Secondary | ICD-10-CM | POA: Insufficient documentation

## 2022-01-23 ENCOUNTER — Encounter: Payer: Medicaid Other | Admitting: Obstetrics and Gynecology

## 2022-01-30 ENCOUNTER — Ambulatory Visit (INDEPENDENT_AMBULATORY_CARE_PROVIDER_SITE_OTHER): Payer: Medicaid Other | Admitting: Advanced Practice Midwife

## 2022-01-30 ENCOUNTER — Other Ambulatory Visit: Payer: Self-pay

## 2022-01-30 VITALS — BP 130/74 | HR 118 | Wt 146.1 lb

## 2022-01-30 DIAGNOSIS — Z3A20 20 weeks gestation of pregnancy: Secondary | ICD-10-CM

## 2022-01-30 DIAGNOSIS — Z3490 Encounter for supervision of normal pregnancy, unspecified, unspecified trimester: Secondary | ICD-10-CM

## 2022-01-30 DIAGNOSIS — O4442 Low lying placenta NOS or without hemorrhage, second trimester: Secondary | ICD-10-CM | POA: Insufficient documentation

## 2022-01-30 MED ORDER — PREPLUS 27-1 MG PO TABS: 1.0000 | ORAL_TABLET | Freq: Every day | ORAL | 6 refills | Status: AC

## 2022-01-30 NOTE — Progress Notes (Signed)
° °  PRENATAL VISIT NOTE  Subjective:  Emily [name] is a 36 y.o. G3P2002 at 104w5d being seen today for ongoing prenatal care.  She is currently monitored for the following issues for this low-risk pregnancy and has Supervision of low-risk pregnancy, unspecified trimester; S/P bilateral breast reduction; Family history of cleft palate; and Low lying placenta nos or without hemorrhage, second trimester on their problem list.  Patient reports  episodes of feeling hot and flushed that resolve .  Contractions: Not present. Vag. Bleeding: None.  Movement: Present. Denies leaking of fluid.   The following portions of the patient's history were reviewed and updated as appropriate: allergies, current medications, past family history, past medical history, past social history, past surgical history and problem list.   Objective:   Vitals:   01/30/22 1549  BP: 130/74  Pulse: (!) 118  Weight: 146 lb 1.6 oz (66.3 kg)    Fetal Status: Fetal Heart Rate (bpm): 154   Movement: Present     General:  Alert, oriented and cooperative. Patient is in no acute distress.  Skin: Skin is warm and dry. No rash noted.   Cardiovascular: Normal heart rate noted  Respiratory: Normal respiratory effort, no problems with respiration noted  Abdomen: Soft, gravid, appropriate for gestational age.  Pain/Pressure: Present     Pelvic: Cervical exam deferred        Extremities: Normal range of motion.  Edema: None  Mental Status: Normal mood and affect. Normal behavior. Normal judgment and thought content.   Assessment and Plan:  Pregnancy: G3P2002 at [redacted]w[redacted]d 1. Supervision of low-risk pregnancy, unspecified trimester --Anticipatory guidance about next visits/weeks of pregnancy given.  --Next visit in 4 weeks --Discussed common s/sx of pregnancy, pt having episodes of feeling hot/flushed that resolve. No other s/x of anemia, Hgb 12.3 at NOB.  Pt to notify office if symptoms worsening.  2. [redacted] weeks gestation of  pregnancy   3. Low lying placenta nos or without hemorrhage, second trimester --Reviewed [place] with pt  Preterm labor symptoms and general obstetric precautions including but not limited to vaginal bleeding, contractions, leaking of fluid and fetal movement were reviewed in detail with the patient. Please refer to After Visit Summary for other counseling recommendations.   Return in about 4 weeks (around 02/27/2022).  Future Appointments  Date Time Provider Department Center  03/02/2022 10:35 AM Bernerd Limbo, CNM [facility]    Sharen Counter, CNM

## 2022-02-01 ENCOUNTER — Telehealth: Payer: Self-pay | Admitting: Family Medicine

## 2022-02-01 NOTE — Telephone Encounter (Signed)
Called patient to let her know that her prescription was sent to her Pharmacy on 2/13. Patient voiced understanding.

## 2022-02-01 NOTE — Telephone Encounter (Signed)
Patient requesting a RX for Prenatal

## 2022-02-04 ENCOUNTER — Encounter: Payer: Self-pay | Admitting: Radiology

## 2022-02-17 ENCOUNTER — Encounter: Payer: Self-pay | Admitting: Family Medicine

## 2022-03-02 ENCOUNTER — Ambulatory Visit (INDEPENDENT_AMBULATORY_CARE_PROVIDER_SITE_OTHER): Payer: Medicaid Other | Admitting: Student

## 2022-03-02 ENCOUNTER — Other Ambulatory Visit: Payer: Self-pay

## 2022-03-02 VITALS — BP 129/85 | HR 87 | Wt 151.2 lb

## 2022-03-02 DIAGNOSIS — Z3A25 25 weeks gestation of pregnancy: Secondary | ICD-10-CM

## 2022-03-02 DIAGNOSIS — Z3402 Encounter for supervision of normal first pregnancy, second trimester: Secondary | ICD-10-CM

## 2022-03-02 NOTE — Progress Notes (Signed)
? ?  PRENATAL VISIT NOTE ? ?Subjective:  ?Emily [name] is a 36 y.o. G3P2002 at [redacted]w[redacted]d being seen today for ongoing prenatal care.  She is currently monitored for the following issues for this low-risk pregnancy and has Supervision of low-risk pregnancy, unspecified trimester; S/P bilateral breast reduction; Family history of cleft palate; and Low lying placenta nos or without hemorrhage, second trimester on their problem list. ? ?Patient reports no complaints.  Patient has been exercising, resting well, and reports diet consists mainly of fruits/vegetables. Contractions: Not present. Vag. Bleeding: None.  Movement: Present. Denies leaking of fluid.  ? ?The following portions of the patient's history were reviewed and updated as appropriate: allergies, current medications, past family history, past medical history, past social history, past surgical history and problem list.  ? ?Objective:  ? ?Vitals:  ? 03/02/22 1053  ?BP: 129/85  ?Pulse: 87  ?Weight: 151 lb 3.2 oz (68.6 kg)  ? ? ?Fetal Status: Fetal Heart Rate (bpm): 145 Fundal Height: 25 cm Movement: Present    ? ?General:  Alert, oriented and cooperative. Patient is in no acute distress.  ?Skin: Skin is warm and dry. No rash noted.   ?Cardiovascular: Normal heart rate noted  ?Respiratory: Normal respiratory effort, no problems with respiration noted  ?Abdomen: Soft, gravid, appropriate for gestational age.  Pain/Pressure: Absent     ?Pelvic: Cervical exam deferred        ?Extremities: Normal range of motion.  Edema: Trace  ?Mental Status: Normal mood and affect. Normal behavior. Normal judgment and thought content.  ? ?Assessment and Plan:  ?Pregnancy: [id number] at [redacted]w[redacted]d ? ? ?Encounter for supervision of low-risk first pregnancy in second trimester ? ?[redacted] weeks gestation of pregnancy ? ?-Anticipatory guidance for GTT/3rd Trimester visit discussed ?- Next visit in ~3 weeks ?- If emergent medical attention is needed, encouraged patient to present to MAU ? ?Preterm  labor symptoms and general obstetric precautions including but not limited to vaginal bleeding, contractions, leaking of fluid and fetal movement were reviewed in detail with the patient. ?Please refer to After Visit Summary for other counseling recommendations.  ? ?Return in about 3 weeks (around 03/23/2022) for LOB/GTT, IN-PERSON. ? ?Future Appointments  ?Date Time Provider [place]  ?03/23/2022  8:15 AM Aletha Halim, MD [facility]  ?03/23/2022  8:50 AM [facility]  ? ? ?Johnston Ebbs, NP ? ?

## 2022-03-20 ENCOUNTER — Other Ambulatory Visit: Payer: Self-pay

## 2022-03-20 DIAGNOSIS — Z3402 Encounter for supervision of normal first pregnancy, second trimester: Secondary | ICD-10-CM

## 2022-03-23 ENCOUNTER — Encounter: Payer: Medicaid Other | Admitting: Obstetrics and Gynecology

## 2022-03-23 ENCOUNTER — Other Ambulatory Visit: Payer: Medicaid Other

## 2022-03-28 ENCOUNTER — Other Ambulatory Visit: Payer: Medicaid Other

## 2022-04-03 ENCOUNTER — Other Ambulatory Visit: Payer: Medicaid Other

## 2022-04-03 ENCOUNTER — Ambulatory Visit (INDEPENDENT_AMBULATORY_CARE_PROVIDER_SITE_OTHER): Payer: Medicaid Other | Admitting: Medical

## 2022-04-03 VITALS — BP 133/89 | HR 93 | Wt 158.0 lb

## 2022-04-03 DIAGNOSIS — Z9889 Other specified postprocedural states: Secondary | ICD-10-CM

## 2022-04-03 DIAGNOSIS — Z3A29 29 weeks gestation of pregnancy: Secondary | ICD-10-CM | POA: Diagnosis not present

## 2022-04-03 DIAGNOSIS — Z23 Encounter for immunization: Secondary | ICD-10-CM

## 2022-04-03 DIAGNOSIS — O4442 Low lying placenta NOS or without hemorrhage, second trimester: Secondary | ICD-10-CM

## 2022-04-03 DIAGNOSIS — Z3402 Encounter for supervision of normal first pregnancy, second trimester: Secondary | ICD-10-CM

## 2022-04-03 DIAGNOSIS — Z8632 Personal history of gestational diabetes: Secondary | ICD-10-CM

## 2022-04-03 DIAGNOSIS — Z3490 Encounter for supervision of normal pregnancy, unspecified, unspecified trimester: Secondary | ICD-10-CM

## 2022-04-03 NOTE — Progress Notes (Signed)
? ?  PRENATAL VISIT NOTE ? ?Subjective:  ?Emily [name] is a 36 y.o. G3P2002 at [redacted]w[redacted]d being seen today for ongoing prenatal care.  She is currently monitored for the following issues for this high-risk pregnancy and has Supervision of low-risk pregnancy, unspecified trimester; S/P bilateral breast reduction; Family history of cleft palate; Low lying placenta nos or without hemorrhage, second trimester; and History of gestational diabetes on their problem list. ? ?Patient reports no complaints.  Contractions: Not present. Vag. Bleeding: None.  Movement: Present. Denies leaking of fluid.  ? ?The following portions of the patient's history were reviewed and updated as appropriate: allergies, current medications, past family history, past medical history, past social history, past surgical history and problem list.  ? ?Objective:  ? ?Vitals:  ? 04/03/22 0829  ?BP: 133/89  ?Pulse: 93  ?Weight: 158 lb (71.7 kg)  ? ? ?Fetal Status: Fetal Heart Rate (bpm): 159   Movement: Present    ? ?General:  Alert, oriented and cooperative. Patient is in no acute distress.  ?Skin: Skin is warm and dry. No rash noted.   ?Cardiovascular: Normal heart rate noted  ?Respiratory: Normal respiratory effort, no problems with respiration noted  ?Abdomen: Soft, gravid, appropriate for gestational age.  Pain/Pressure: Absent     ?Pelvic: Cervical exam deferred        ?Extremities: Normal range of motion.  Edema: None  ?Mental Status: Normal mood and affect. Normal behavior. Normal judgment and thought content.  ? ?Assessment and Plan:  ?Pregnancy: [id number] at [redacted]w[redacted]d ?1. Supervision of low-risk pregnancy, unspecified trimester ?- GTT, CBC, HIV, RPR today  ?- Tdap vaccine greater than or equal to 7yo IM ?- Unsure about MOC, patient questioning whether partial hysterectomy would be appropriate given previous pap smear results, will have admin staff follow-up on records for further discussion, advised that this was likely not indicated, but we would need  to review the results.  ?- Planning TAPM for peds ? ?2. Low lying placenta nos or without hemorrhage, second trimester ?- [place] MFM OB FOLLOW UP; scheduled ? ?3. S/P bilateral breast reduction ?- Will need to schedule with Ernestina Patches or Gilford Rile for further discussion  ? ?4. History of gestational diabetes ?- GTT today  ? ?5. [redacted] weeks gestation of pregnancy ? ?Preterm labor symptoms and general obstetric precautions including but not limited to vaginal bleeding, contractions, leaking of fluid and fetal movement were reviewed in detail with the patient. ?Please refer to After Visit Summary for other counseling recommendations.  ? ?Return in about 2 weeks (around 04/17/2022) for LOB, In-Person - with Samella Parr or [place] if possible. ? ?Future Appointments  ?Date Time Provider [place]  ?04/19/2022  3:30 PM [facility] [place]  ? ? ?Kerry Hough, PA-C ? ?

## 2022-04-03 NOTE — Progress Notes (Signed)
Ms. Chea is here for routine prenatal care. I informed patient that she is due for her Tdap vaccine and she accepted. Vaccine was administered into right deltoid without any complications. All questions and concerns were addressed. ? ? ?Anelia Carriveau, CMA  ? ?04/03/22 ?

## 2022-04-04 ENCOUNTER — Telehealth: Payer: Self-pay | Admitting: *Deleted

## 2022-04-04 ENCOUNTER — Encounter: Payer: Self-pay | Admitting: Obstetrics and Gynecology

## 2022-04-04 ENCOUNTER — Ambulatory Visit: Payer: No Typology Code available for payment source

## 2022-04-04 DIAGNOSIS — O24419 Gestational diabetes mellitus in pregnancy, unspecified control: Secondary | ICD-10-CM | POA: Insufficient documentation

## 2022-04-04 LAB — RPR: RPR Ser Ql: NONREACTIVE

## 2022-04-04 LAB — CBC
Hematocrit: 34.2 % (ref 34.0–46.6)
Hemoglobin: 11.4 g/dL (ref 11.1–15.9)
MCH: 29.7 pg (ref 26.6–33.0)
MCHC: 33.3 g/dL (ref 31.5–35.7)
MCV: 89 fL (ref 79–97)
Platelets: 267 10*3/uL (ref 150–450)
RBC: 3.84 x10E6/uL (ref 3.77–5.28)
RDW: 13 % (ref 11.7–15.4)
WBC: 11.5 10*3/uL — ABNORMAL HIGH (ref 3.4–10.8)

## 2022-04-04 LAB — GLUCOSE TOLERANCE, 2 HOURS W/ 1HR
Glucose, 1 hour: 291 mg/dL — ABNORMAL HIGH (ref 70–179)
Glucose, 2 hour: 255 mg/dL — ABNORMAL HIGH (ref 70–152)
Glucose, Fasting: 106 mg/dL — ABNORMAL HIGH (ref 70–91)

## 2022-04-04 LAB — HIV ANTIBODY (ROUTINE TESTING W REFLEX): HIV Screen 4th Generation wRfx: NONREACTIVE

## 2022-04-04 NOTE — Telephone Encounter (Signed)
I called Emily [name] and informed her of results and recommendations. I scheduled her for diabetes education first available appt on 04/18/22. She states she had GDM with first child, so she is aware.  ?Emily [name] ?

## 2022-04-04 NOTE — Telephone Encounter (Signed)
-----   Message from Belmar Bing, MD sent at 04/04/2022  9:06 AM EDT ----- ?She has GDM. [facility] set her up with supplies, classes, etc. thanks ?

## 2022-04-05 ENCOUNTER — Telehealth: Payer: Self-pay

## 2022-04-05 ENCOUNTER — Telehealth: Payer: Self-pay | Admitting: General Practice

## 2022-04-05 ENCOUNTER — Ambulatory Visit (INDEPENDENT_AMBULATORY_CARE_PROVIDER_SITE_OTHER): Payer: Medicaid Other | Admitting: General Practice

## 2022-04-05 VITALS — BP 125/84 | HR 95 | Ht 60.0 in | Wt 158.0 lb

## 2022-04-05 DIAGNOSIS — Z0130 Encounter for examination of blood pressure without abnormal findings: Secondary | ICD-10-CM

## 2022-04-05 DIAGNOSIS — O24410 Gestational diabetes mellitus in pregnancy, diet controlled: Secondary | ICD-10-CM

## 2022-04-05 LAB — GLUCOSE, CAPILLARY: Glucose-Capillary: 94 mg/dL (ref 70–99)

## 2022-04-05 MED ORDER — ACCU-CHEK GUIDE VI STRP: ORAL_STRIP | 12 refills | Status: DC

## 2022-04-05 MED ORDER — ACCU-CHEK SOFTCLIX LANCETS MISC: 12 refills | Status: DC

## 2022-04-05 MED ORDER — ACCU-CHEK GUIDE W/DEVICE KIT: 1.0000 | PACK | 0 refills | Status: DC | PRN

## 2022-04-05 NOTE — Telephone Encounter (Signed)
Patient called into front office asking questions about her due date and gestational age. Patient states I told her she was 30 weeks today and next week she will be 40 weeks on the 28th and feels like that is too late. Told patient a pregnancy week & an actual week are the same amount of time. Discussed every Wednesday she turns a new week. Told patient next week she will be 31 weeks, the Wednesday after that she will be 32 weeks and so on. Reviewed with patient her due date is 6/28 and she will be 40 weeks on that date. Patient states she cannot go past 40 weeks because that would be bad right? Told patient no she can go past 40 weeks as a lot of pregnant women do but she will likely be induced early due to GDM. Patient verbalized understanding & had no other questions. ?

## 2022-04-05 NOTE — Addendum Note (Signed)
Addended by: Shelly Coss on: 04/05/2022 12:12 PM ? ? Modules accepted: Orders ? ?

## 2022-04-05 NOTE — Progress Notes (Signed)
Patient presents to office today reporting seeing spots and dizziness for the past month. Reports this happens sporadically. Denies headaches.  Patient has been newly diagnosed with GDM. Reports eating a granola bar this morning around 7am. Blood sugar is 94 in the office this morning. BP in office is 125/84. Advised patient to make a note whenever she notices the spots/dizziness and let us know if things get worse. Recommended adequate hydration and locating BP cuff at home so it's available when needed. Patient verbalized understanding and stated she is concerned about how far out her diabetes education appt is. Rescheduled diabetes education appt to 4/26 for a class and informed patient. Patient verbalized understanding. ? ?Chase Caller RN BSN ?04/05/22 ? ?

## 2022-04-05 NOTE — Telephone Encounter (Signed)
Pt call transferred from the front office and pt reports that she has been having intermittent spots in her vision and feels that it has been related to her new dx GDM.  I asked pt if she has a BP cuff at home.  Pt states that she is currently moving and it is packed up.  I asked pt she will be able to come in today for a BP check and CBG check this am.  Pt reports that she will be able to come before 1000.   ? ?Buren Havey,RN  ?04/05/22 ?

## 2022-04-12 ENCOUNTER — Ambulatory Visit: Payer: Medicaid Other

## 2022-04-14 ENCOUNTER — Encounter (HOSPITAL_COMMUNITY): Payer: Self-pay | Admitting: Obstetrics and Gynecology

## 2022-04-14 ENCOUNTER — Inpatient Hospital Stay (HOSPITAL_COMMUNITY)
Admission: AD | Admit: 2022-04-14 | Discharge: 2022-04-14 | Disposition: A | Discharge status: Home / Self Care | Payer: Medicaid Other | Attending: Obstetrics and Gynecology | Admitting: Obstetrics and Gynecology

## 2022-04-14 DIAGNOSIS — R350 Frequency of micturition: Secondary | ICD-10-CM | POA: Insufficient documentation

## 2022-04-14 DIAGNOSIS — R109 Unspecified abdominal pain: Secondary | ICD-10-CM | POA: Insufficient documentation

## 2022-04-14 DIAGNOSIS — Z3689 Encounter for other specified antenatal screening: Secondary | ICD-10-CM | POA: Diagnosis not present

## 2022-04-14 DIAGNOSIS — Z3A31 31 weeks gestation of pregnancy: Secondary | ICD-10-CM | POA: Diagnosis not present

## 2022-04-14 DIAGNOSIS — O26893 Other specified pregnancy related conditions, third trimester: Secondary | ICD-10-CM | POA: Insufficient documentation

## 2022-04-14 DIAGNOSIS — O24415 Gestational diabetes mellitus in pregnancy, controlled by oral hypoglycemic drugs: Secondary | ICD-10-CM

## 2022-04-14 LAB — HEMOGLOBIN A1C
Hgb A1c MFr Bld: 5.2 % (ref 4.8–5.6)
Mean Plasma Glucose: 102.54 mg/dL

## 2022-04-14 LAB — COMPREHENSIVE METABOLIC PANEL
ALT: 15 U/L (ref 0–44)
AST: 19 U/L (ref 15–41)
Albumin: 2.9 g/dL — ABNORMAL LOW (ref 3.5–5.0)
Alkaline Phosphatase: 75 U/L (ref 38–126)
Anion gap: 9 (ref 5–15)
BUN: 7 mg/dL (ref 6–20)
CO2: 18 mmol/L — ABNORMAL LOW (ref 22–32)
Calcium: 8.9 mg/dL (ref 8.9–10.3)
Chloride: 108 mmol/L (ref 98–111)
Creatinine, Ser: 0.56 mg/dL (ref 0.44–1.00)
GFR, Estimated: >60 mL/min (ref >60)
Glucose, Bld: 186 mg/dL — ABNORMAL HIGH (ref 70–99)
Potassium: 4.1 mmol/L (ref 3.5–5.1)
Sodium: 135 mmol/L (ref 135–145)
Total Bilirubin: 0.4 mg/dL (ref 0.3–1.2)
Total Protein: 6 g/dL — ABNORMAL LOW (ref 6.5–8.1)

## 2022-04-14 LAB — URINALYSIS, ROUTINE W REFLEX MICROSCOPIC
Bacteria, UA: NONE SEEN
Bilirubin Urine: NEGATIVE
Glucose, UA: >=500 mg/dL — AB
Hgb urine dipstick: NEGATIVE
Ketones, ur: NEGATIVE mg/dL
Leukocytes,Ua: NEGATIVE
Nitrite: NEGATIVE
Protein, ur: NEGATIVE mg/dL
Specific Gravity, Urine: 1.001 — ABNORMAL LOW (ref 1.005–1.030)
pH: 6 (ref 5.0–8.0)

## 2022-04-14 LAB — CBC
HCT: 33.1 % — ABNORMAL LOW (ref 36.0–46.0)
Hemoglobin: 11 g/dL — ABNORMAL LOW (ref 12.0–15.0)
MCH: 29.8 pg (ref 26.0–34.0)
MCHC: 33.2 g/dL (ref 30.0–36.0)
MCV: 89.7 fL (ref 80.0–100.0)
Platelets: 238 10*3/uL (ref 150–400)
RBC: 3.69 MIL/uL — ABNORMAL LOW (ref 3.87–5.11)
RDW: 13.4 % (ref 11.5–15.5)
WBC: 10 10*3/uL (ref 4.0–10.5)
nRBC: 0 % (ref 0.0–0.2)

## 2022-04-14 LAB — GLUCOSE, CAPILLARY
Glucose-Capillary: 206 mg/dL — ABNORMAL HIGH (ref 70–99)
Glucose-Capillary: 86 mg/dL (ref 70–99)

## 2022-04-14 MED ORDER — INSULIN ASPART 100 UNIT/ML IJ SOLN: 0.0000 [IU] | Freq: Three times a day (TID) | INTRAMUSCULAR | Status: DC

## 2022-04-14 MED ORDER — METFORMIN HCL 1000 MG PO TABS
1000.0000 mg | ORAL_TABLET | Freq: Two times a day (BID) | ORAL | 0 refills | Status: DC
Start: 2022-04-14 — End: 2022-04-27

## 2022-04-14 MED ORDER — INSULIN ASPART 100 UNIT/ML IJ SOLN
6.0000 [IU] | Freq: Once | INTRAMUSCULAR | Status: AC
  Administered 2022-04-14: 6 [IU] via SUBCUTANEOUS

## 2022-04-14 MED ORDER — LACTATED RINGERS IV BOLUS
1000.0000 mL | Freq: Once | INTRAVENOUS | Status: AC
  Administered 2022-04-14: 1000 mL via INTRAVENOUS

## 2022-04-14 NOTE — MAU Note (Signed)
...  Emily [name] is a 36 y.o. at [redacted]w[redacted]d here in MAU reporting: Intermittent bilateral lower abdominal pain that began since 0545 this morning. She states this made her nervous so she checked her blood sugar and the meter read "Lo." She states she immediately became nervous and started to feel dizzy so she drank one cup of apple juice. She states around 0630 she began to see floaters in her eyes and is still experiencing them. She is also endorsing urinary frequency. She denies burning with urination but states when she urinates it does not feel as if she emptied her bladder. No VB or LOF. DFM, have not felt any movement since 0200 this morning. ? ?CBG 30 minutes ago "240 or 250." ? ?Last PO: ?Last night around 1900 ?Cup of apple juice 0630 ? ?Pain score:  ?5/10 bilateral lower abdomen ? ?CBG in triage: 206 ?  ?FHT: 155 initial external ?Lab orders placed from triage:  POCT CBG and UA ? ?

## 2022-04-14 NOTE — Progress Notes (Signed)
Inpatient Diabetes Program Recommendations ? ?AACE/ADA: New Consensus Statement on Inpatient Glycemic Control (2015) ? ?Target Ranges:  Prepandial:   less than 140 mg/dL ?     Peak postprandial:   less than 180 mg/dL (1-2 hours) ?     Critically ill patients:  140 - 180 mg/dL  ? ?Lab Results  ?Component Value Date  ? GLUCAP 86 04/14/2022  ? HGBA1C 5.2 04/14/2022  ? ? ?Review of Glycemic Control ? Latest Reference Range & Units 04/14/22 08:01 04/14/22 10:15  ?Glucose-Capillary 70 - 99 mg/dL 144 (H) 86  ?(H): Data is abnormally high ?Diabetes history: GDM ?Outpatient Diabetes medications: none ?Current orders for Inpatient glycemic control: Novolog 6 units x 1 ? ?Inpatient Diabetes Program Recommendations:   ? ?Spoke with patient regarding outpatient diabetes management. Patient reports reading of "low" this AM when first waking up on meter, however, no symptoms. Out of fear she consumed >50 CHO and got scared when readings were above 200's mg/dL.  ?Reviewed patient's current A1c of 5.2%. Explained what a A1c is and what it measures. Also reviewed goal A1c with patient, importance of good glucose control @ home, and blood sugar goals. Reviewed patho of DM, pregnancy goals, when to check CBGS, when to treat, survival skills, interventions, Metformin benefits/side effects, insulin resistance in pregnancy, importance of being mindful with CHO intake, plate method, CHO alottment, carb counting, when to call MD, vascular changes and commorbidities.  ?Patient understands and has no further questions at this time.  ? ?Thanks, ?Lujean Rave, MSN, RNC-OB ?Diabetes Coordinator ?[phone] (8a-5p) ? ? ? ? ? ?

## 2022-04-14 NOTE — MAU Provider Note (Signed)
?History  ?  ? ?CSN: [id number] ? ?Arrival date and time: 04/14/22 0750 ? ?None  ?  ?Chief Complaint  ?Patient presents with  ? Hypoglycemia  ? Abdominal Pain  ? Urinary Frequency  ? ?HPI ?Emily [name] is a 36 y.o. G3P2002 at 43w2dwho presents to MAU with multiple complaints: ? ?Hypoglycemia ?Patient's pregnancy is c/b A1GDM. Patient felt mildly dizzy, checked her blood sugar and received a  "low" reading. She drank a glass of apple juice. She reports a subsequent home CBG reading of 240 or 250 about 30 minutes prior to her arrival in MAU. She reports hx of GDM with her previous pregnancy but has never been prescribed insulin. ? ?Patient's 24 hour diet recall includes chik fil a breaded chicken sandwich with fries + coke zero at 1900 hours last night. She had a bowl of reese's puff cereal with 2% milk around 0300. ? ?Floaters ?Patient reports seeing floaters in her field of vision beginning around 0630 this morning. She continues to see them on arrival to MAU. Patient reports history of spots in her field of vision and believes they correlate to elevated CBGs. ? ?Abdominal pain ?This is a new problem, onset around 0545 this morning. Patient's pain score is 5/10. Pain is located bilaterally in her lower abdomen. Pain improves with rest, intensifies with prolonged walking or standing. She has not taken medication of tried other treatments for this complaint. ? ?OB History   ? ? Gravida  ?3  ? Para  ?2  ? Term  ?2  ? Preterm  ?   ? AB  ?   ? Living  ?2  ?  ? ? SAB  ?   ? IAB  ?   ? Ectopic  ?   ? Multiple  ?   ? Live Births  ?2  ?   ?  ?  ? ? ?Past Medical History:  ?Diagnosis Date  ? Gestational diabetes   ? ? ?Past Surgical History:  ?Procedure Laterality Date  ? BREAST REDUCTION SURGERY  08/04/2020  ? CHOLECYSTECTOMY    ? ? ?Family History  ?Problem Relation Age of Onset  ? Diabetes Mother   ? Diabetes Father   ? ? ?Social History  ? ?Tobacco Use  ? Smoking status: Never  ? Smokeless tobacco: Never  ?Substance Use  Topics  ? Alcohol use: Never  ? Drug use: Never  ? ? ?Allergies: No Known Allergies ? ?Medications Prior to Admission  ?Medication Sig Dispense Refill Last Dose  ? Accu-Chek Softclix Lancets lancets Use as instructed (Patient not taking: Reported on 04/05/2022) 100 each 12   ? aspirin 81 MG chewable tablet Chew 1 tablet (81 mg total) by mouth daily. (Patient not taking: Reported on 04/05/2022) 90 tablet 1   ? Blood Glucose Monitoring Suppl (ACCU-CHEK GUIDE) w/Device KIT 1 Device by Does not apply route as needed. (Patient not taking: Reported on 04/05/2022) 1 kit 0   ? Blood Pressure Monitoring (BLOOD PRESSURE KIT) DEVI 1 Device by Does not apply route once a week. (Patient not taking: Reported on 01/30/2022) 1 each 0   ? glucose blood (ACCU-CHEK GUIDE) test strip Use as instructed (Patient not taking: Reported on 04/05/2022) 100 each 12   ? Prenatal Vit-Fe Fumarate-FA (PREPLUS) 27-1 MG TABS Take 1 tablet by mouth daily. 30 tablet 6   ? ? ?Review of Systems  ?Eyes:  Positive for visual disturbance.  ?Gastrointestinal:  Positive for abdominal pain.  ?Neurological:  Positive for  dizziness.  ?All other systems reviewed and are negative. ?Physical Exam  ? ?Blood pressure 132/71, temperature 98.9 ?F (37.2 ?C), temperature source Oral, resp. rate 17, last menstrual period 09/01/2021, SpO2 100 %. ? ?Physical Exam ?Vitals and nursing note reviewed. Exam conducted with a chaperone present.  ?Constitutional:   ?   Appearance: She is well-developed. She is obese. She is not ill-appearing.  ?Cardiovascular:  ?   Rate and Rhythm: Normal rate and regular rhythm.  ?   Heart sounds: Normal heart sounds.  ?Pulmonary:  ?   Effort: Pulmonary effort is normal.  ?   Breath sounds: Normal breath sounds.  ?Abdominal:  ?   Palpations: Abdomen is soft.  ?   Tenderness: There is no abdominal tenderness. There is no right CVA tenderness or left CVA tenderness.  ?   Comments: Gravid  ?Skin: ?   Capillary Refill: Capillary refill takes less than 2  seconds.  ?Neurological:  ?   Mental Status: She is alert and oriented to person, place, and time.  ? ? ?MAU Course  ?Procedures ? ?MDM ?- [facility] RN note 04/05/2022 documents one month history of spots in field of vision as well as dizziness ?- CBG 206 on arrival to MAU. Consult with Dr. Cy Blamer and Dr. Ilda Basset. Will given fluid bolus, insulin 6 units per Antepartum Hyperglycemia Protocol. Will also request bedside DM Coordinator prior to MAU discharge ?--Reactive tracing: baseline 145, mod var, +accels, no decels ?--Patient to be worked into [employer] schedule on Monday for CBG assessment s/p Metformin initiation per Dr. Ilda Basset. Appt confirmed with [facility] front desk and Dr. Elgie Congo, who will make room on his schedule. CNM discussed with patient that if her CBGs are out of range she may be direct admit to [facility] for poorly management A2GDM ? ?Orders Placed This Encounter  ?Procedures  ? Urinalysis, Routine w reflex microscopic Urine, Clean Catch  ? Glucose, capillary  ? CBC  ? Comprehensive metabolic panel  ? Hemoglobin A1c  ? Glucose, capillary  ? Notify physician (specify)  ? If fasting CBG < 80 mg/dL, contact MD for TID AC CBGs.  ? Consult to diabetes coordinator  ? Insert peripheral IV  ? Saline lock IV  ? Discharge patient  ? ?Patient Vitals for the past 24 hrs: ? BP Temp Temp src Pulse Resp SpO2  ?04/14/22 0828 125/74 -- -- 97 -- --  ?04/14/22 0806 132/71 98.9 ?F (37.2 ?C) Oral -- 17 100 %  ? ?Results for orders placed or performed during the hospital encounter of 04/14/22 (from the past 24 hour(s))  ?Glucose, capillary     Status: Abnormal  ? Collection Time: 04/14/22  8:01 AM  ?Result Value Ref Range  ? Glucose-Capillary 206 (H) 70 - 99 mg/dL  ?Urinalysis, Routine w reflex microscopic Urine, Clean Catch     Status: Abnormal  ? Collection Time: 04/14/22  8:10 AM  ?Result Value Ref Range  ? Color, Urine COLORLESS (A) YELLOW  ? APPearance CLEAR CLEAR  ? Specific Gravity, Urine 1.001 (L) 1.005 - 1.030  ? pH 6.0 5.0 - 8.0  ?  Glucose, UA >=500 (A) NEGATIVE mg/dL  ? Hgb urine dipstick NEGATIVE NEGATIVE  ? Bilirubin Urine NEGATIVE NEGATIVE  ? Ketones, ur NEGATIVE NEGATIVE mg/dL  ? Protein, ur NEGATIVE NEGATIVE mg/dL  ? Nitrite NEGATIVE NEGATIVE  ? Leukocytes,Ua NEGATIVE NEGATIVE  ? RBC / HPF 0-5 0 - 5 RBC/hpf  ? WBC, UA 0-5 0 - 5 WBC/hpf  ? Bacteria, UA NONE SEEN  NONE SEEN  ? Squamous Epithelial / LPF 0-5 0 - 5  ?CBC     Status: Abnormal  ? Collection Time: 04/14/22  8:38 AM  ?Result Value Ref Range  ? WBC 10.0 4.0 - 10.5 K/uL  ? RBC 3.69 (L) 3.87 - 5.11 MIL/uL  ? Hemoglobin 11.0 (L) 12.0 - 15.0 g/dL  ? HCT 33.1 (L) 36.0 - 46.0 %  ? MCV 89.7 80.0 - 100.0 fL  ? MCH 29.8 26.0 - 34.0 pg  ? MCHC 33.2 30.0 - 36.0 g/dL  ? RDW 13.4 11.5 - 15.5 %  ? Platelets 238 150 - 400 K/uL  ? nRBC 0.0 0.0 - 0.2 %  ?Comprehensive metabolic panel     Status: Abnormal  ? Collection Time: 04/14/22  8:38 AM  ?Result Value Ref Range  ? Sodium 135 135 - 145 mmol/L  ? Potassium 4.1 3.5 - 5.1 mmol/L  ? Chloride 108 98 - 111 mmol/L  ? CO2 18 (L) 22 - 32 mmol/L  ? Glucose, Bld 186 (H) 70 - 99 mg/dL  ? BUN 7 6 - 20 mg/dL  ? Creatinine, Ser 0.56 0.44 - 1.00 mg/dL  ? Calcium 8.9 8.9 - 10.3 mg/dL  ? Total Protein 6.0 (L) 6.5 - 8.1 g/dL  ? Albumin 2.9 (L) 3.5 - 5.0 g/dL  ? AST 19 15 - 41 U/L  ? ALT 15 0 - 44 U/L  ? Alkaline Phosphatase 75 38 - 126 U/L  ? Total Bilirubin 0.4 0.3 - 1.2 mg/dL  ? GFR, Estimated >60 >60 mL/min  ? Anion gap 9 5 - 15  ?Hemoglobin A1c     Status: None  ? Collection Time: 04/14/22  8:38 AM  ?Result Value Ref Range  ? Hgb A1c MFr Bld 5.2 4.8 - 5.6 %  ? Mean Plasma Glucose 102.54 mg/dL  ?Glucose, capillary     Status: None  ? Collection Time: 04/14/22 10:15 AM  ?Result Value Ref Range  ? Glucose-Capillary 86 70 - 99 mg/dL  ? ?Meds ordered this encounter  ?Medications  ? insulin aspart (novoLOG) injection 6 Units  ? insulin aspart (novoLOG) injection 0-16 Units  ?  Order Specific Question:   CBG < 60:  ?  Answer:   call physician  ?  Order Specific  Question:   CBG < 70:  ?  Answer:   Implement Hypoglycemia Standing Orders and refer to Hypoglycemia Standing Orders sidebar report  ?  Order Specific Question:   CBG 70 - 90 (dose in units):  ?  Answer:   0  ?

## 2022-04-17 ENCOUNTER — Telehealth: Payer: Medicaid Other | Admitting: Obstetrics and Gynecology

## 2022-04-17 NOTE — Progress Notes (Signed)
Attempt to call patient for her video visit. ?No answer- [phone]  ?

## 2022-04-17 NOTE — Progress Notes (Signed)
Called pt at 1420; pt states she will not be ready until 2:35 PM. Encouraged pt to log into MyChart when ready. ? ?Pt did not log into MyChart. Pt did not answer follow up call from CMA. Called pt again at 1509 and left VM stating pt will need to reschedule.  ? Fleet Contras RN ?04/17/22 ?

## 2022-04-18 ENCOUNTER — Other Ambulatory Visit: Payer: Medicaid Other

## 2022-04-18 DIAGNOSIS — Z91199 Patient's noncompliance with other medical treatment and regimen due to unspecified reason: Secondary | ICD-10-CM | POA: Insufficient documentation

## 2022-04-19 ENCOUNTER — Other Ambulatory Visit: Payer: Self-pay | Admitting: Medical

## 2022-04-19 ENCOUNTER — Ambulatory Visit: Payer: Medicaid Other | Admitting: *Deleted

## 2022-04-19 ENCOUNTER — Encounter: Payer: Medicaid Other | Admitting: Certified Nurse Midwife

## 2022-04-19 ENCOUNTER — Ambulatory Visit: Payer: Medicaid Other | Attending: Medical

## 2022-04-19 ENCOUNTER — Encounter: Payer: Self-pay | Admitting: *Deleted

## 2022-04-19 VITALS — BP 130/79 | HR 83

## 2022-04-19 DIAGNOSIS — O24415 Gestational diabetes mellitus in pregnancy, controlled by oral hypoglycemic drugs: Secondary | ICD-10-CM

## 2022-04-19 DIAGNOSIS — O4442 Low lying placenta NOS or without hemorrhage, second trimester: Secondary | ICD-10-CM | POA: Diagnosis not present

## 2022-04-19 DIAGNOSIS — O283 Abnormal ultrasonic finding on antenatal screening of mother: Secondary | ICD-10-CM | POA: Insufficient documentation

## 2022-04-19 DIAGNOSIS — Z3A29 29 weeks gestation of pregnancy: Secondary | ICD-10-CM

## 2022-04-19 NOTE — Procedures (Signed)
Emily [name] ?[birth year] ?[redacted]w[redacted]d ? ?Fetus A Non-Stress Test Interpretation for 04/19/22 ? ?Indication: Unsatisfactory BPP ? ?Fetal Heart Rate A ?Mode: External ?Baseline Rate (A): 145 bpm ?Variability: Moderate ?Accelerations: 15 x 15 ?Decelerations: Variable ? ?Uterine Activity ?Mode: Palpation, Toco ?Contraction Frequency (min): ui ?Contraction Quality: Mild ?Resting Tone Palpated: Relaxed ?Resting Time: Adequate ? ?Interpretation (Fetal Testing) ?Nonstress Test Interpretation: Reactive ?Overall Impression: Reassuring for gestational age ?Comments: Dr. Parke Poisson reviewed tracing ? ? ?

## 2022-04-20 ENCOUNTER — Other Ambulatory Visit: Payer: Self-pay | Admitting: *Deleted

## 2022-04-20 DIAGNOSIS — O24419 Gestational diabetes mellitus in pregnancy, unspecified control: Secondary | ICD-10-CM

## 2022-04-24 ENCOUNTER — Telehealth: Payer: Self-pay | Admitting: Family Medicine

## 2022-04-24 ENCOUNTER — Ambulatory Visit: Payer: Medicaid Other | Admitting: *Deleted

## 2022-04-24 ENCOUNTER — Encounter: Payer: Self-pay | Admitting: *Deleted

## 2022-04-24 ENCOUNTER — Ambulatory Visit: Payer: Medicaid Other | Attending: Obstetrics

## 2022-04-24 VITALS — BP 134/80 | HR 92

## 2022-04-24 DIAGNOSIS — O4443 Low lying placenta NOS or without hemorrhage, third trimester: Secondary | ICD-10-CM

## 2022-04-24 DIAGNOSIS — O24419 Gestational diabetes mellitus in pregnancy, unspecified control: Secondary | ICD-10-CM | POA: Insufficient documentation

## 2022-04-24 DIAGNOSIS — Z3A32 32 weeks gestation of pregnancy: Secondary | ICD-10-CM

## 2022-04-24 DIAGNOSIS — O24415 Gestational diabetes mellitus in pregnancy, controlled by oral hypoglycemic drugs: Secondary | ICD-10-CM

## 2022-04-24 MED ORDER — ACCU-CHEK GUIDE VI STRP
ORAL_STRIP | 12 refills | Status: DC
Start: 2022-04-24 — End: 2022-11-13

## 2022-04-24 NOTE — Telephone Encounter (Signed)
Patient needing a RX for test strips.  ?

## 2022-04-24 NOTE — Telephone Encounter (Signed)
Glucose test strips sent to pharmacy on file. Pt called and notified.  ?Claude Swendsen,RN  ?

## 2022-04-25 ENCOUNTER — Encounter: Payer: Medicaid Other | Admitting: Medical

## 2022-04-27 ENCOUNTER — Telehealth: Payer: Self-pay | Admitting: General Practice

## 2022-04-27 DIAGNOSIS — O24415 Gestational diabetes mellitus in pregnancy, controlled by oral hypoglycemic drugs: Secondary | ICD-10-CM

## 2022-04-27 MED ORDER — METFORMIN HCL 1000 MG PO TABS: 1000.0000 mg | ORAL_TABLET | Freq: Two times a day (BID) | ORAL | 0 refills | Status: DC

## 2022-04-27 NOTE — Telephone Encounter (Signed)
Patient called into front office stating she needs a refill on her metformin. Per chart review, patient has not been seen since 4/17 due to missed appt on 5/1. Next scheduled prenatal visit is 5/30. Asked for most recent blood sugars and patient provided the following information. ?5/11 fasting- 99 ?5/10 f- 104, b-163 ?5/9 f-89, b- 125, d- 118 ?5/8 f- 110, b- 161, l- 103, d- 147 ?5/7 f-93 b- 138, d-152 ?5/5 f- 86, l- 106, d- 64/80 ?5/4 f- 103, l- 170, l- 68 ? ?Patient states she sometimes doesn't wake up until 11 so she will miss lunch or breakfast. Reviewed blood sugars with Dr Shawnie Pons who authorizes refill on Metformin & if patient is not consistently waking up until 11 then she should take the Metformin at bedtime. Discussed with patient and reviewed appt scheduled for Monday 5/15. Patient verbalized understanding to all.  ? ?

## 2022-05-01 ENCOUNTER — Ambulatory Visit (INDEPENDENT_AMBULATORY_CARE_PROVIDER_SITE_OTHER): Payer: Medicaid Other | Admitting: Medical

## 2022-05-01 ENCOUNTER — Encounter: Payer: Self-pay | Admitting: Medical

## 2022-05-01 VITALS — BP 115/76 | HR 99 | Wt 158.0 lb

## 2022-05-01 DIAGNOSIS — Z9889 Other specified postprocedural states: Secondary | ICD-10-CM

## 2022-05-01 DIAGNOSIS — O24415 Gestational diabetes mellitus in pregnancy, controlled by oral hypoglycemic drugs: Secondary | ICD-10-CM

## 2022-05-01 DIAGNOSIS — Z3A33 33 weeks gestation of pregnancy: Secondary | ICD-10-CM

## 2022-05-01 DIAGNOSIS — O4442 Low lying placenta NOS or without hemorrhage, second trimester: Secondary | ICD-10-CM

## 2022-05-01 DIAGNOSIS — Z3490 Encounter for supervision of normal pregnancy, unspecified, unspecified trimester: Secondary | ICD-10-CM

## 2022-05-01 NOTE — Progress Notes (Signed)
? ?  PRENATAL VISIT NOTE ? ?Subjective:  ?Emily [name] is a 36 y.o. G3P2002 at [redacted]w[redacted]d being seen today for ongoing prenatal care.  She is currently monitored for the following issues for this high-risk pregnancy and has Supervision of low-risk pregnancy, unspecified trimester; S/P bilateral breast reduction; Family history of cleft palate; Low lying placenta nos or without hemorrhage, second trimester; History of gestational diabetes; and GDM (gestational diabetes mellitus) on their problem list. ? ?Patient reports fatigue.  Contractions: Not present. Vag. Bleeding: None.  Movement: Present. Denies leaking of fluid.  ? ?The following portions of the patient's history were reviewed and updated as appropriate: allergies, current medications, past family history, past medical history, past social history, past surgical history and problem list.  ? ?Objective:  ? ?Vitals:  ? 05/01/22 1009  ?BP: 115/76  ?Pulse: 99  ?Weight: 158 lb (71.7 kg)  ? ? ?Fetal Status: Fetal Heart Rate (bpm): 145   Movement: Present    ? ?General:  Alert, oriented and cooperative. Patient is in no acute distress.  ?Skin: Skin is warm and dry. No rash noted.   ?Cardiovascular: Normal heart rate noted  ?Respiratory: Normal respiratory effort, no problems with respiration noted  ?Abdomen: Soft, gravid, appropriate for gestational age.  Pain/Pressure: Absent     ?Pelvic: Cervical exam deferred        ?Extremities: Normal range of motion.  Edema: None  ?Mental Status: Normal mood and affect. Normal behavior. Normal judgment and thought content.  ? ?Assessment and Plan:  ?Pregnancy: [id number] at [redacted]w[redacted]d ?1. Supervision of low-risk pregnancy, unspecified trimester ?-  Desires BTL, consent signed today  ? ?2. Low lying placenta nos or without hemorrhage, second trimester ?- Resolved on last [place]  ? ?3. Gestational diabetes mellitus (GDM) in third trimester controlled on oral hypoglycemic drug ?- Log sheet given, CBGs reviewed and mostly normal, although not  checking on appropriate intervals  ?- Discussed QID CBGs and log sheet at every visit  ?- Has DM Education tomorrow  ?- MFM antenatal testing weekly already scheduled  ?- Plan for delivery 39-39.6 weeks assuming well-controlled on Metformin  ? ?4. S/P bilateral breast reduction ?- Needs to see [place]  ? ?5. [redacted] weeks gestation of pregnancy ? ?Preterm labor symptoms and general obstetric precautions including but not limited to vaginal bleeding, contractions, leaking of fluid and fetal movement were reviewed in detail with the patient. ?Please refer to After Visit Summary for other counseling recommendations.  ? ?Return in about 2 weeks (around 05/15/2022) for [facility] APP, In-Person, any provider. ? ?Future Appointments  ?Date Time Provider [place]  ?05/02/2022 11:15 AM [facility]-EDUCATION [facility]  ?05/04/2022  9:30 AM [facility] NURSE [facility]  ?05/04/2022  9:45 AM [facility] NST [facility]  ?05/11/2022  1:15 PM [facility] NURSE [facility]  ?05/11/2022  1:30 PM [facility]-MFC US3 [facility]  ?05/16/2022 10:55 AM Patriciaann Clan, DO [facility]  ?05/18/2022  1:30 PM [facility] NURSE [facility]  ?05/18/2022  1:45 PM [facility] US5 [facility]  ? ? ?Kerry Hough, PA-C ? ?

## 2022-05-01 NOTE — Progress Notes (Signed)
Patient here for routine prenatal car. She reports having trouble sleeping at night. Denies any stress or anxiety. Otherwise, she is feeling fine  ?

## 2022-05-02 ENCOUNTER — Encounter: Payer: Medicaid Other | Attending: Obstetrics and Gynecology | Admitting: Registered"

## 2022-05-02 ENCOUNTER — Ambulatory Visit (INDEPENDENT_AMBULATORY_CARE_PROVIDER_SITE_OTHER): Payer: Medicaid Other | Admitting: Registered"

## 2022-05-02 DIAGNOSIS — Z3A00 Weeks of gestation of pregnancy not specified: Secondary | ICD-10-CM | POA: Insufficient documentation

## 2022-05-02 DIAGNOSIS — Z713 Dietary counseling and surveillance: Secondary | ICD-10-CM | POA: Insufficient documentation

## 2022-05-02 DIAGNOSIS — O24410 Gestational diabetes mellitus in pregnancy, diet controlled: Secondary | ICD-10-CM | POA: Insufficient documentation

## 2022-05-02 DIAGNOSIS — O24415 Gestational diabetes mellitus in pregnancy, controlled by oral hypoglycemic drugs: Secondary | ICD-10-CM

## 2022-05-02 NOTE — Progress Notes (Signed)
Patient was seen for Gestational Diabetes self-management on 05/02/22  ?Start time 1125 and End time 1220  ? ?Estimated due date: 06/14/22; [redacted]w[redacted]d ? ?Clinical: ?Medications: metformin 1000 mg bid  ?Medical History: prior GDM pt reports did not use medication  ?Labs: OGTT 106(H)-291(H)-255(H); A1c 5.2% 04/14/22 at hospital encounter  ? ?Dietary and Lifestyle History: ?Although this is first visit with diabetes educator, Pt states states he received information about carbs and what to eat and what not to eat from [place]. ? ?Pt states she still has random diarrhea from metformin ? ?SMBG: pt states since she has been checking her PPBG has not gone over 130 mg/dL. Today during visit it was 2 hrs after breakfast and her CBG was 190 mg/dL. Considering she is taking 2000 mg metformin, RD believes she needs to be evaluated before her next ob visit in 2 weeks. ? ?After patient visit RD was able to reach Dr. Elonda Husky (second attending) for his input. Reply in chat: ?called the patient and discussed in detail her CBG log. She is doing OK with the metformin but she states it is improving ?I went over them in detail and I don't think there is a change to make right now ? ?Patient was checking blood sugar when waking up, however this is not a true reflection of FBS because she eats once or twice most nights. Pt states when she first wakes up at 2 am her last food intake will have been about 7 hours. RD instructed patient to check blood sugar at 2 am before she gets a snack and use that reading for her FBS log sheet. ? ?Physical Activity: "a lot" was going to [facility] daily, doing water aerobics and the weight machines. Doesn't use treadmill d/t SOB with walking ?Stress: not assessed ?Sleep: is not able to sleep more than a couple of hours at a time. ? ?Family history of diabetes: Father T1D brittle, currently is nursing home; mother T2D, sister (deceased) T1D, niece T1D. ? ?Pt states she has witnessed complications from diabetes and is  motivated to manage. Pt states she has been having changes to her vision but BG was not that high. ? ?24 hr Recall:  ?First Meal: breakfast burrito (states she was told this was a good choice, but burrito had potatoes in it), water ?Snack: breakfast bar OR apple ?Second meal: same as dinner ?Snack: Emmaline Kluver ?Third meal: cabbage soup with pork, potatoes, carrots. 1 slice of bread ?Snack: (2 am) 1/2 PB sandwich (no jelly);  ?Beverages: mostly water, some sugar-free drinks ? ?NUTRITION INTERVENTION  ?Nutrition education (E-1) on the following topics:  ? ?Initial Follow-up ? ?[x]  []  Definition of Gestational Diabetes ?[x]  []  Why dietary management is important in controlling blood glucose ?[x]  []  Effects each nutrient has on blood glucose levels ?[x]  []  Simple carbohydrates vs complex carbohydrates ?[x]  []  Fluid intake ?[]  []  Creating a balanced meal plan ?[x]  []  Carbohydrate counting  ?[x]  []  When to check blood glucose levels ?[x]  []  Proper blood glucose monitoring techniques ?[x]  []  Effect of stress and stress reduction techniques  ?[x]  []  Exercise effect on blood glucose levels, appropriate exercise during pregnancy ?[x]  []  Importance of limiting caffeine and abstaining from alcohol and smoking ?[x]  []  Medications used for blood sugar control during pregnancy ?[x]  []  Hypoglycemia and rule of 15 ?[x]  []  Postpartum self care ? ?Patient already has a meter, is testing pre breakfast and 2 hours after each meal. ?FBS: ? ?Postprandial: pt states not higher than 130 other than today  which was 190 ? ?Patient instructed to monitor glucose levels: ?FBS: 60 - ? 95 mg/dL (some clinics use 90 for cutoff) ?1 hour: ? 140 mg/dL ?2 hour: ? 120 mg/dL ? ?Patient received handouts: ?Nutrition Diabetes and Pregnancy ?Carbohydrate Counting List ? ?Patient will be seen for follow-up as needed.  ?

## 2022-05-02 NOTE — Patient Instructions (Addendum)
To get a more accurate fasting blood sugar reading, check when you wake up ~2 am before you eat a snack. Write this number on your log sheet. ? ?

## 2022-05-03 ENCOUNTER — Encounter: Payer: Self-pay | Admitting: *Deleted

## 2022-05-04 ENCOUNTER — Ambulatory Visit (HOSPITAL_BASED_OUTPATIENT_CLINIC_OR_DEPARTMENT_OTHER): Payer: Medicaid Other | Admitting: *Deleted

## 2022-05-04 ENCOUNTER — Ambulatory Visit: Payer: Medicaid Other

## 2022-05-04 ENCOUNTER — Ambulatory Visit: Payer: Medicaid Other | Attending: Obstetrics | Admitting: *Deleted

## 2022-05-04 ENCOUNTER — Encounter: Payer: Self-pay | Admitting: *Deleted

## 2022-05-04 VITALS — BP 128/71 | HR 81

## 2022-05-04 DIAGNOSIS — Z3A34 34 weeks gestation of pregnancy: Secondary | ICD-10-CM | POA: Insufficient documentation

## 2022-05-04 DIAGNOSIS — O24415 Gestational diabetes mellitus in pregnancy, controlled by oral hypoglycemic drugs: Secondary | ICD-10-CM

## 2022-05-04 NOTE — Procedures (Signed)
Emily [name] 11/05/1986 [redacted]w[redacted]d  Fetus A Non-Stress Test Interpretation for 05/04/22  Indication: Gestational Diabetes medication controlled  Fetal Heart Rate A Mode: External Baseline Rate (A): 140 bpm Variability: Moderate Accelerations: 15 x 15 Decelerations: None Multiple birth?: No  Uterine Activity Mode: Toco Contraction Frequency (min): UI Resting Tone Palpated: Relaxed  Interpretation (Fetal Testing) Nonstress Test Interpretation: Reactive Overall Impression: Reassuring for gestational age Comments: tracing reviewed byDr. Gertie Exon

## 2022-05-09 ENCOUNTER — Other Ambulatory Visit: Payer: Self-pay | Admitting: Medical

## 2022-05-09 DIAGNOSIS — G4701 Insomnia due to medical condition: Secondary | ICD-10-CM

## 2022-05-09 MED ORDER — ZOLPIDEM TARTRATE ER 6.25 MG PO TBCR: 6.2500 mg | EXTENDED_RELEASE_TABLET | Freq: Every evening | ORAL | 0 refills | Status: DC | PRN

## 2022-05-10 ENCOUNTER — Other Ambulatory Visit: Payer: Self-pay | Admitting: Medical

## 2022-05-10 DIAGNOSIS — G4701 Insomnia due to medical condition: Secondary | ICD-10-CM

## 2022-05-10 MED ORDER — ZOLPIDEM TARTRATE 5 MG PO TABS: 5.0000 mg | ORAL_TABLET | Freq: Every evening | ORAL | 0 refills | Status: DC | PRN

## 2022-05-11 ENCOUNTER — Ambulatory Visit: Payer: Medicaid Other

## 2022-05-11 ENCOUNTER — Ambulatory Visit: Payer: Medicaid Other | Attending: Obstetrics | Admitting: *Deleted

## 2022-05-11 ENCOUNTER — Ambulatory Visit (HOSPITAL_BASED_OUTPATIENT_CLINIC_OR_DEPARTMENT_OTHER): Payer: Medicaid Other

## 2022-05-11 ENCOUNTER — Other Ambulatory Visit: Payer: Self-pay | Admitting: *Deleted

## 2022-05-11 VITALS — BP 127/73 | HR 103

## 2022-05-11 DIAGNOSIS — O24415 Gestational diabetes mellitus in pregnancy, controlled by oral hypoglycemic drugs: Secondary | ICD-10-CM

## 2022-05-11 DIAGNOSIS — Z3A35 35 weeks gestation of pregnancy: Secondary | ICD-10-CM

## 2022-05-11 DIAGNOSIS — O24419 Gestational diabetes mellitus in pregnancy, unspecified control: Secondary | ICD-10-CM

## 2022-05-16 ENCOUNTER — Other Ambulatory Visit: Payer: Self-pay | Admitting: Family Medicine

## 2022-05-16 ENCOUNTER — Encounter: Payer: Medicaid Other | Admitting: Family Medicine

## 2022-05-16 DIAGNOSIS — O24415 Gestational diabetes mellitus in pregnancy, controlled by oral hypoglycemic drugs: Secondary | ICD-10-CM

## 2022-05-17 ENCOUNTER — Other Ambulatory Visit: Payer: Self-pay | Admitting: Medical

## 2022-05-17 DIAGNOSIS — O24415 Gestational diabetes mellitus in pregnancy, controlled by oral hypoglycemic drugs: Secondary | ICD-10-CM

## 2022-05-17 MED ORDER — METFORMIN HCL 1000 MG PO TABS: 1000.0000 mg | ORAL_TABLET | Freq: Two times a day (BID) | ORAL | 0 refills | Status: DC

## 2022-05-18 ENCOUNTER — Ambulatory Visit: Payer: Medicaid Other | Admitting: *Deleted

## 2022-05-18 ENCOUNTER — Ambulatory Visit: Payer: Medicaid Other | Attending: Obstetrics

## 2022-05-18 VITALS — BP 128/73 | HR 94

## 2022-05-18 DIAGNOSIS — O24419 Gestational diabetes mellitus in pregnancy, unspecified control: Secondary | ICD-10-CM | POA: Diagnosis present

## 2022-05-18 DIAGNOSIS — O24415 Gestational diabetes mellitus in pregnancy, controlled by oral hypoglycemic drugs: Secondary | ICD-10-CM | POA: Insufficient documentation

## 2022-05-18 DIAGNOSIS — Z3A36 36 weeks gestation of pregnancy: Secondary | ICD-10-CM | POA: Diagnosis not present

## 2022-05-22 ENCOUNTER — Ambulatory Visit (INDEPENDENT_AMBULATORY_CARE_PROVIDER_SITE_OTHER): Payer: Medicaid Other | Admitting: Advanced Practice Midwife

## 2022-05-22 ENCOUNTER — Other Ambulatory Visit (HOSPITAL_COMMUNITY)
Admission: RE | Admit: 2022-05-22 | Discharge: 2022-05-22 | Disposition: A | Discharge status: Home / Self Care | Payer: Medicaid Other | Source: Ambulatory Visit | Attending: Advanced Practice Midwife | Admitting: Advanced Practice Midwife

## 2022-05-22 VITALS — BP 117/77 | HR 91 | Wt 162.0 lb

## 2022-05-22 DIAGNOSIS — O24415 Gestational diabetes mellitus in pregnancy, controlled by oral hypoglycemic drugs: Secondary | ICD-10-CM

## 2022-05-22 DIAGNOSIS — Z3A36 36 weeks gestation of pregnancy: Secondary | ICD-10-CM

## 2022-05-22 DIAGNOSIS — O0993 Supervision of high risk pregnancy, unspecified, third trimester: Secondary | ICD-10-CM

## 2022-05-22 MED ORDER — METFORMIN HCL 1000 MG PO TABS: 1500.0000 mg | ORAL_TABLET | Freq: Two times a day (BID) | ORAL | 1 refills | Status: DC

## 2022-05-22 NOTE — Progress Notes (Signed)
   PRENATAL VISIT NOTE  Subjective:  Emily [name] is a 36 y.o. G3P2002 at [redacted]w[redacted]d being seen today for ongoing prenatal care.  She is currently monitored for the following issues for this high-risk pregnancy and has Supervision of high risk pregnancy, antepartum; S/P bilateral breast reduction; Family history of cleft palate; Low lying placenta nos or without hemorrhage, second trimester; History of gestational diabetes; and GDM (gestational diabetes mellitus) on their problem list.  Patient reports occasional contractions.  Contractions: Irritability. Vag. Bleeding: None.  Movement: Present. Denies leaking of fluid.   The following portions of the patient's history were reviewed and updated as appropriate: allergies, current medications, past family history, past medical history, past social history, past surgical history and problem list.   Objective:   Vitals:   05/22/22 1449  BP: 117/77  Pulse: 91  Weight: 162 lb (73.5 kg)    Fetal Status: Fetal Heart Rate (bpm): 141 Fundal Height: 37 cm Movement: Present  Presentation: Vertex  General:  Alert, oriented and cooperative. Patient is in no acute distress.  Skin: Skin is warm and dry. No rash noted.   Cardiovascular: Normal heart rate noted  Respiratory: Normal respiratory effort, no problems with respiration noted  Abdomen: Soft, gravid, appropriate for gestational age.  Pain/Pressure: Present     Pelvic: Cervical exam performed in the presence of a chaperone Dilation: 1.5 Effacement (%): 70 Station: -2  Extremities: Normal range of motion.  Edema: None  Mental Status: Normal mood and affect. Normal behavior. Normal judgment and thought content.   Assessment and Plan:  Pregnancy: G3P2002 at [redacted]w[redacted]d 1. Gestational diabetes mellitus (GDM) in third trimester controlled on oral hypoglycemic drug --Reviewed glucose log.  More than 50% of fasting glucose values above 95 but pt reports she eats frequently during the night so is never fasting.   12 out of 60 postprandial values elevated, most 120s to 130s but some 150s to 180s.  Pt reports eating high carb meals/snacks and having trouble with following diabetic diet. --Discussed options, including diet changes, insulin, and increasing Metformin.  Pt would like to try increase in Metformin and reevaluate next week. --Scheduled IOL for 39 weeks on 6/21, orders placed  - metFORMIN (GLUCOPHAGE) 1000 MG tablet; Take 1.5 tablets (1,500 mg total) by mouth 2 (two) times daily with a meal.  Dispense: 60 tablet; Refill: 1  2. [redacted] weeks gestation of pregnancy   3. Supervision of high risk pregnancy in third trimester --Anticipatory guidance about next visits/weeks of pregnancy given.   - GC/Chlamydia probe amp ([place])not at [facility] - Culture, beta strep (group b only)    Term labor symptoms and general obstetric precautions including but not limited to vaginal bleeding, contractions, leaking of fluid and fetal movement were reviewed in detail with the patient. Please refer to After Visit Summary for other counseling recommendations.   Return in about 1 week (around 05/29/2022) for As scheduled on 6/12.  Future Appointments  Date Time Provider [place]  05/24/2022  2:30 PM [facility] NURSE [facility]  05/24/2022  2:45 PM [facility] US6 [facility]  05/29/2022 11:15 AM Fortine Sink [facility]  06/01/2022  9:30 AM [facility] NURSE [facility]  06/01/2022  9:45 AM [facility] US6 [facility]  06/07/2022  9:55 AM Gilford Rile, Cristy Folks, CNM [facility]    Fatima Blank, CNM

## 2022-05-22 NOTE — Patient Instructions (Signed)
Things to Try After 37 weeks to Encourage Labor/Get Ready for Labor:    Try the Miles Circuit at www.milescircuit.com daily to improve baby's position and encourage the onset of labor.  Walk a little and rest a little every day.  Change positions often.  Cervical Ripening: May try one or both Red Raspberry Leaf capsules or tea:  two 300mg or 400mg tablets with each meal, 2-3 times a day, or 1-3 cups of tea daily  Potential Side Effects Of Raspberry Leaf:  Most women do not experience any side effects from drinking raspberry leaf tea. However, nausea and loose stools are possible   Evening Primrose Oil capsules: take 1 capsule by mouth and place one capsule in the vagina every night.    Some of the potential side effects:  Upset stomach  Loose stools or diarrhea  Headaches  Nausea  Sex can also help the cervix ripen and encourage labor onset.    Labor Precautions Reasons to come to MAU at [facility]:  1.  Contractions are  5 minutes apart or less, each last 1 minute, these have been going on for 1-2 hours, and you cannot walk or talk during them 2.  You have a large gush of fluid, or a trickle of fluid that will not stop and you have to wear a pad 3.  You have bleeding that is bright red, heavier than spotting--like menstrual bleeding (spotting can be normal in early labor or after a check of your cervix) 4.  You do not feel the baby moving like he/she normally does  

## 2022-05-23 LAB — GC/CHLAMYDIA PROBE AMP (~~LOC~~) NOT AT ARMC
Chlamydia: NEGATIVE
Comment: NEGATIVE
Comment: NORMAL
Neisseria Gonorrhea: NEGATIVE

## 2022-05-24 ENCOUNTER — Ambulatory Visit: Payer: Medicaid Other | Attending: Obstetrics

## 2022-05-24 ENCOUNTER — Ambulatory Visit: Payer: Medicaid Other | Admitting: *Deleted

## 2022-05-24 VITALS — BP 132/71 | HR 90

## 2022-05-24 DIAGNOSIS — O0990 Supervision of high risk pregnancy, unspecified, unspecified trimester: Secondary | ICD-10-CM

## 2022-05-24 DIAGNOSIS — O24415 Gestational diabetes mellitus in pregnancy, controlled by oral hypoglycemic drugs: Secondary | ICD-10-CM | POA: Insufficient documentation

## 2022-05-24 DIAGNOSIS — Z3A37 37 weeks gestation of pregnancy: Secondary | ICD-10-CM | POA: Diagnosis not present

## 2022-05-26 ENCOUNTER — Telehealth (HOSPITAL_COMMUNITY): Payer: Self-pay | Admitting: *Deleted

## 2022-05-26 ENCOUNTER — Encounter (HOSPITAL_COMMUNITY): Payer: Self-pay | Admitting: *Deleted

## 2022-05-26 LAB — CULTURE, BETA STREP (GROUP B ONLY): Strep Gp B Culture: NEGATIVE

## 2022-05-26 NOTE — Telephone Encounter (Signed)
Preadmission screen  

## 2022-05-29 ENCOUNTER — Telehealth: Payer: Self-pay

## 2022-05-29 ENCOUNTER — Ambulatory Visit (INDEPENDENT_AMBULATORY_CARE_PROVIDER_SITE_OTHER): Payer: Medicaid Other

## 2022-05-29 ENCOUNTER — Encounter (HOSPITAL_COMMUNITY): Payer: Self-pay | Admitting: Family Medicine

## 2022-05-29 ENCOUNTER — Inpatient Hospital Stay (HOSPITAL_COMMUNITY)
Admission: AD | Admit: 2022-05-29 | Discharge: 2022-05-29 | Disposition: A | Discharge status: Home / Self Care | Payer: Medicaid Other | Source: Home / Self Care | Attending: Family Medicine | Admitting: Family Medicine

## 2022-05-29 ENCOUNTER — Other Ambulatory Visit: Payer: Self-pay

## 2022-05-29 VITALS — BP 123/83 | HR 101 | Wt 164.0 lb

## 2022-05-29 DIAGNOSIS — O0993 Supervision of high risk pregnancy, unspecified, third trimester: Secondary | ICD-10-CM | POA: Insufficient documentation

## 2022-05-29 DIAGNOSIS — O26893 Other specified pregnancy related conditions, third trimester: Secondary | ICD-10-CM | POA: Insufficient documentation

## 2022-05-29 DIAGNOSIS — Z7984 Long term (current) use of oral hypoglycemic drugs: Secondary | ICD-10-CM | POA: Insufficient documentation

## 2022-05-29 DIAGNOSIS — Z3A37 37 weeks gestation of pregnancy: Secondary | ICD-10-CM | POA: Insufficient documentation

## 2022-05-29 DIAGNOSIS — O219 Vomiting of pregnancy, unspecified: Secondary | ICD-10-CM

## 2022-05-29 DIAGNOSIS — Z9889 Other specified postprocedural states: Secondary | ICD-10-CM

## 2022-05-29 DIAGNOSIS — O24415 Gestational diabetes mellitus in pregnancy, controlled by oral hypoglycemic drugs: Secondary | ICD-10-CM | POA: Insufficient documentation

## 2022-05-29 DIAGNOSIS — O0990 Supervision of high risk pregnancy, unspecified, unspecified trimester: Secondary | ICD-10-CM

## 2022-05-29 DIAGNOSIS — O471 False labor at or after 37 completed weeks of gestation: Secondary | ICD-10-CM | POA: Insufficient documentation

## 2022-05-29 DIAGNOSIS — Z8632 Personal history of gestational diabetes: Secondary | ICD-10-CM | POA: Diagnosis present

## 2022-05-29 MED ORDER — NALBUPHINE HCL 10 MG/ML IJ SOLN
10.0000 mg | Freq: Once | INTRAMUSCULAR | Status: AC
  Administered 2022-05-29: 10 mg via INTRAMUSCULAR
  Filled 2022-05-29: qty 1

## 2022-05-29 MED ORDER — ONDANSETRON 4 MG PO TBDP: 4.0000 mg | ORAL_TABLET | Freq: Three times a day (TID) | ORAL | 0 refills | Status: DC | PRN

## 2022-05-29 MED ORDER — DIPHENHYDRAMINE HCL 25 MG PO CAPS
25.0000 mg | ORAL_CAPSULE | Freq: Once | ORAL | Status: AC
  Administered 2022-05-29: 25 mg via ORAL
  Filled 2022-05-29: qty 1

## 2022-05-29 NOTE — MAU Provider Note (Signed)
   PROCEDURAL NOTE  Subjective:  Emily [name] is a 36 y.o. G3P2002 at [redacted]w[redacted]d being seen today for ongoing prenatal care.  She is currently monitored for the following issues for this high-risk pregnancy and has Supervision of high risk pregnancy, antepartum; S/P bilateral breast reduction; Family history of cleft palate; Low lying placenta nos or without hemorrhage, second trimester; History of gestational diabetes; and GDM (gestational diabetes mellitus) on their problem list.  Patient reports contractions since for several weeks, but worse after my OB appointment today . Vag. Bleeding: None. Denies leaking of fluid.   The following portions of the patient's history were reviewed and updated as appropriate: allergies, current medications, past family history, past medical history, past social history, past surgical history and problem list. Problem list updated.  Objective:   Vitals:   05/29/22 1702 05/29/22 2027 05/29/22 2031  BP: 123/73 124/80   Pulse: (!) 104 94   Resp: 18    Temp: 99.4 F (37.4 C)    TempSrc: Oral    SpO2: 98%  97%  Weight: 74.1 kg    Height: 5' (1.524 m)      Fetal Status: Presentation: Vertex  General:  Alert, oriented and cooperative. Patient is in no acute distress.  Skin: Skin is warm and dry. No rash noted.   Cardiovascular: Normal heart rate noted  Respiratory: Normal respiratory effort, no problems with respiration noted  Abdomen: Soft, gravid, appropriate for gestational age.        Pelvic: Cervical exam performed Dilation: 1.5 Effacement (%): 50 Station: -3  Extremities: Normal range of motion.     Mental Status:  Normal mood and affect. Normal behavior. Normal judgment and thought content.  Procedure: Patient informed of R/B/A of procedure. NST was performed and was reactive prior to procedure. NST:  EFM: Baseline: 140 bpm, Variability: Good {> 6 bpm), Accelerations: Reactive, and Decelerations: Absent Toco: irregular UCs Procedure done to  begin ripening of the cervix prior to admission for induction of labor. Appropriate time out taken. The patient was placed in the lithotomy position and the cervix brought into view with sterile speculum. A ring forcep was used to guide the 54F foley through the internal os of the cervix. Foley Balloon filled with 60cc of sterile water. Plug inserted into end of the foley. Foley placed on tension and taped to medial thigh.  NST:  EFM Baseline: 145 bpm, Variability: Good {> 6 bpm), Accelerations: Reactive, and Decelerations: Absent  Toco: irregular UCs There were no signs of tachysystole or hypertonus. All equipment was removed and accounted for. The patient tolerated the procedure well.  Assessment and Plan:  Pregnancy: G3P2002 at [redacted]w[redacted]d 1. False labor after 37 completed weeks of gestation  2. Gestational diabetes mellitus (GDM) in third trimester controlled on oral hypoglycemic drug  3. [redacted] weeks gestation of pregnancy   S/p Outpatient placement of foley balloon catheter for cervical ripening. Induction of labor scheduled for tomorrow at 1200 am. Reassuring FHR tracing with no concerns at present. Warning signs given to patient to include return to MAU for heavy vaginal bleeding, Rupture of membranes, painful uterine contractions q 5 mins or less, severe abdominal discomfort, decreased fetal movement.   Laury Deep, CNM 05/29/2022 8:05 PM

## 2022-05-29 NOTE — MAU Note (Signed)
Pt discharged to home with foley bulb in place - 3 separate samples -all were negative per RN and CNM. Explained to pt-she has had no additional leakage.

## 2022-05-29 NOTE — MAU Note (Signed)
Pt reports feeling warm and calm following Nubain administration. R.Dawson in to review plan of care-questions answered.

## 2022-05-29 NOTE — MAU Note (Signed)
Emily [name] is a 36 y.o. at [redacted]w[redacted]d here in MAU reporting: contracting, is bleeding and having "fluid".  Pt was checked this morning, was 5 cm, was told she needed to be induced tonight at midnight. Can't keep track of contractions.  Unable to tell if mucous with blood or if water is leaking, "all mixed together".  Onset of complaint: started about 1230 Pain score: 8 Vitals:   05/29/22 1702  BP: 123/73  Pulse: (!) 104  Resp: 18  Temp: 99.4 F (37.4 C)  SpO2: 98%     FHT:154 Lab orders placed from triage:  none

## 2022-05-29 NOTE — Progress Notes (Signed)
   PRENATAL VISIT NOTE  Subjective:  Emily [name] is a 36 y.o. G3P2002 at [redacted]w[redacted]d being seen today for ongoing prenatal care.  She is currently monitored for the following issues for this high-risk pregnancy and has Supervision of high risk pregnancy, antepartum; S/P bilateral breast reduction; Family history of cleft palate; Low lying placenta nos or without hemorrhage, second trimester; History of gestational diabetes; and GDM (gestational diabetes mellitus) on their problem list.  Patient reports nausea.  Contractions: Irritability. Vag. Bleeding: None.  Movement: Present. Denies leaking of fluid.   The following portions of the patient's history were reviewed and updated as appropriate: allergies, current medications, past family history, past medical history, past social history, past surgical history and problem list.   Objective:   Vitals:   05/29/22 1123  BP: 123/83  Pulse: (!) 101  Weight: 164 lb (74.4 kg)    Fetal Status: Fetal Heart Rate (bpm): 138   Movement: Present  Presentation: Vertex  General:  Alert, oriented and cooperative. Patient is in no acute distress.  Skin: Skin is warm and dry. No rash noted.   Cardiovascular: Normal heart rate noted  Respiratory: Normal respiratory effort, no problems with respiration noted  Abdomen: Soft, gravid, appropriate for gestational age.  Pain/Pressure: Present     Pelvic: Cervical exam performed in the presence of a chaperone Dilation: 1.5 Effacement (%): 50 Station: -3  Extremities: Normal range of motion.     Mental Status: Normal mood and affect. Normal behavior. Normal judgment and thought content.   Assessment and Plan:  Pregnancy: G3P2002 at [redacted]w[redacted]d 1. Nausea and vomiting during pregnancy - Requesting refill on zofran  - ondansetron (ZOFRAN-ODT) 4 MG disintegrating tablet; Take 1 tablet (4 mg total) by mouth every 8 (eight) hours as needed for nausea or vomiting.  Dispense: 15 tablet; Refill: 0  2. Supervision of high risk  pregnancy, antepartum - Routine OB. Doing well, no concerns  3. Gestational diabetes mellitus (GDM) in third trimester controlled on oral hypoglycemic drug - Metformin was increased to 1,500mg  BID last visit. CBG's uncontrolled. 17/24 of numbers above range. Patient reports she wakes up in the middle of the night and eats, usually high carb food (cereal, sandwiches, apples), so fasting numbers aren't fasting. Fasting numbers recorded range from 98-210 and 2hr PP numbers range from 82-176.  - Given uncontrolled GDM, IOL moved up. There is an opening on 6/13, however patient unable to go on that day due to child care. Will move to Thursday 6/15 per c/w Dr. Harolyn Rutherford.   Term labor symptoms and general obstetric precautions including but not limited to vaginal bleeding, contractions, leaking of fluid and fetal movement were reviewed in detail with the patient. Please refer to After Visit Summary for other counseling recommendations.   No follow-ups on file.  Future Appointments  Date Time Provider [place]  06/01/2022  6:45 AM [facility] None  06/01/2022  9:30 AM [facility] NURSE [facility]  06/01/2022  9:45 AM [facility] US6 [facility]  06/07/2022  9:55 AM Gilford Rile, Cristy Folks, CNM [facility]    Renee Harder, CNM

## 2022-05-29 NOTE — MAU Note (Signed)
Pt tolerated IM Nubain-medications explained prior to administration. Pt reported feeling leaking. Fern slide collected and was negative.

## 2022-05-29 NOTE — MAU Provider Note (Signed)
History     CSN: [id number]  Arrival date and time: 05/29/22 1647   Event Date/Time   First Provider Initiated Contact with Patient 05/29/22 1741      Chief Complaint  Patient presents with   Contractions   Rupture of Membranes   Emily [name] is a 36 y.o. year old G45P2002 female at 71w5dweeks gestation who presents to MAU reporting contractions (rated 8/10), vaginal bleeding and unsure of LOF started at 143today after her OB appointment today. She stated she was "dilated 1.5 cm this AM and was told she needed to induced tonight at midnight." At her OB appointment today, she was offered an IOL time slot of midnight. She declined the midnight IOL slot d/t "childcare issues." She and her husband would like to be induced now at midnight. She has not been tracking contractions, but states "the contractions are worse than they have been in weeks. The fluid is a mix of blood, mucous and fluid." [facility] Her spouse is present and contributing to the history taking.  OB History     Gravida  3   Para  2   Term  2   Preterm      AB      Living  2      SAB      IAB      Ectopic      Multiple      Live Births  2           Past Medical History:  Diagnosis Date   Gestational diabetes     Past Surgical History:  Procedure Laterality Date   BREAST REDUCTION SURGERY  08/04/2020   CHOLECYSTECTOMY      Family History  Problem Relation Age of Onset   Hypertension Mother    Diabetes Mother    Stroke Father    Kidney disease Father    Hypertension Father    Diabetes Father    Cancer Maternal Grandmother    Diabetes Maternal Grandmother    Diabetes Paternal Grandmother    Heart disease Neg Hx     Social History   Tobacco Use   Smoking status: Never   Smokeless tobacco: Never  Vaping Use   Vaping Use: Never used  Substance Use Topics   Alcohol use: Not Currently   Drug use: Never    Allergies: No Known Allergies  Medications  Prior to Admission  Medication Sig Dispense Refill Last Dose   Accu-Chek Softclix Lancets lancets Use as instructed 100 each 12    aspirin 81 MG chewable tablet Chew 1 tablet (81 mg total) by mouth daily. 90 tablet 1    Blood Glucose Monitoring Suppl (ACCU-CHEK GUIDE) w/Device KIT 1 Device by Does not apply route as needed. 1 kit 0    Blood Pressure Monitoring (BLOOD PRESSURE KIT) DEVI 1 Device by Does not apply route once a week. 1 each 0    glucose blood (ACCU-CHEK GUIDE) test strip Use as instructed 100 each 12    metFORMIN (GLUCOPHAGE) 1000 MG tablet Take 1.5 tablets (1,500 mg total) by mouth 2 (two) times daily with a meal. 60 tablet 1    ondansetron (ZOFRAN-ODT) 4 MG disintegrating tablet Take 1 tablet (4 mg total) by mouth every 8 (eight) hours as needed for nausea or vomiting. 15 tablet 0    Prenatal Vit-Fe Fumarate-FA (PREPLUS) 27-1 MG TABS Take 1 tablet by mouth daily. 30 tablet 6    zolpidem (  AMBIEN) 5 MG tablet Take 1 tablet (5 mg total) by mouth at bedtime as needed for sleep. 15 tablet 0     Review of Systems  Constitutional: Negative.   HENT: Negative.    Eyes: Negative.   Respiratory: Negative.    Cardiovascular: Negative.   Gastrointestinal: Negative.   Endocrine: Negative.   Genitourinary:  Positive for pelvic pain (increased pressure).  Musculoskeletal: Negative.   Skin: Negative.   Allergic/Immunologic: Negative.   Neurological: Negative.   Hematological: Negative.   Psychiatric/Behavioral: Negative.     Physical Exam   Blood pressure 123/73, pulse (!) 104, temperature 99.4 F (37.4 C), temperature source Oral, resp. rate 18, height 5' (1.524 m), weight 74.1 kg, last menstrual period 09/01/2021, SpO2 98 %.  Physical Exam Vitals and nursing note reviewed. Exam conducted with a chaperone present.  Constitutional:      Appearance: Normal appearance. She is normal weight.  Cardiovascular:     Rate and Rhythm: Tachycardia present.  Genitourinary:    General:  Normal vulva.     Comments: Dilation: 1.5 Effacement (%): 50 Cervical Position: Posterior Station: -3 Presentation: Vertex Exam by:: Iori Gigante cnm  Musculoskeletal:        General: Normal range of motion.  Skin:    General: Skin is warm and dry.  Neurological:     Mental Status: She is alert and oriented to person, place, and time.  Psychiatric:        Mood and Affect: Mood normal.        Behavior: Behavior normal.        Thought Content: Thought content normal.        Judgment: Judgment normal.  REACTIVE NST - FHR: 145 bpm / moderate variability / accels present / decels absent / TOCO: irregular UCs   MAU Course  Procedures  MDM CEFM Cervical Balloon insertion -- see separate procedure note  Assessment and Plan  1. False labor after 37 completed weeks of gestation - Information provided on signs of labor and FB insertion - Return to L&D for IOL at [place] - Advised to return to MAU sooner, if SROM, VB like a period or DFM/no FM  2. Gestational diabetes mellitus (GDM) in third trimester controlled on oral hypoglycemic drug  3. [redacted] weeks gestation of pregnancy   - Discharge home - Keep scheduled IOL appt - Patient verbalized an understanding of the plan of care and agrees.   Laury Deep, CNM 05/29/2022, 5:42 PM

## 2022-05-29 NOTE — Telephone Encounter (Signed)
Patient called and made aware that IOL has been moved up to 06/01/2022. All questions answered.

## 2022-05-29 NOTE — Discharge Instructions (Signed)

## 2022-05-29 NOTE — H&P (Signed)
Opened in error

## 2022-05-30 ENCOUNTER — Inpatient Hospital Stay (HOSPITAL_COMMUNITY): Payer: Medicaid Other | Admitting: Anesthesiology

## 2022-05-30 ENCOUNTER — Encounter (HOSPITAL_COMMUNITY): Payer: Self-pay | Admitting: Obstetrics and Gynecology

## 2022-05-30 ENCOUNTER — Inpatient Hospital Stay (HOSPITAL_COMMUNITY)
Admission: AD | Admit: 2022-05-30 | Payer: Medicaid Other | Source: Home / Self Care | Admitting: Obstetrics and Gynecology

## 2022-05-30 ENCOUNTER — Inpatient Hospital Stay (HOSPITAL_COMMUNITY): Payer: Medicaid Other

## 2022-05-30 ENCOUNTER — Inpatient Hospital Stay (HOSPITAL_COMMUNITY)
Admission: AD | Admit: 2022-05-30 | Discharge: 2022-06-01 | DRG: 798 | Disposition: A | Discharge status: Home / Self Care | Payer: Medicaid Other | Attending: Family Medicine | Admitting: Family Medicine

## 2022-05-30 DIAGNOSIS — Z3A37 37 weeks gestation of pregnancy: Secondary | ICD-10-CM | POA: Diagnosis not present

## 2022-05-30 DIAGNOSIS — O0990 Supervision of high risk pregnancy, unspecified, unspecified trimester: Secondary | ICD-10-CM

## 2022-05-30 DIAGNOSIS — O24425 Gestational diabetes mellitus in childbirth, controlled by oral hypoglycemic drugs: Secondary | ICD-10-CM | POA: Diagnosis present

## 2022-05-30 DIAGNOSIS — O24415 Gestational diabetes mellitus in pregnancy, controlled by oral hypoglycemic drugs: Secondary | ICD-10-CM | POA: Diagnosis present

## 2022-05-30 DIAGNOSIS — Z9889 Other specified postprocedural states: Secondary | ICD-10-CM

## 2022-05-30 DIAGNOSIS — Z302 Encounter for sterilization: Secondary | ICD-10-CM

## 2022-05-30 LAB — GLUCOSE, CAPILLARY
Glucose-Capillary: 178 mg/dL — ABNORMAL HIGH (ref 70–99)
Glucose-Capillary: 92 mg/dL (ref 70–99)
Glucose-Capillary: 117 mg/dL — ABNORMAL HIGH (ref 70–99)

## 2022-05-30 LAB — RPR: RPR Ser Ql: NONREACTIVE

## 2022-05-30 LAB — CBC
HCT: 32.2 % — ABNORMAL LOW (ref 36.0–46.0)
Hemoglobin: 10.9 g/dL — ABNORMAL LOW (ref 12.0–15.0)
MCH: 29 pg (ref 26.0–34.0)
MCHC: 33.9 g/dL (ref 30.0–36.0)
MCV: 85.6 fL (ref 80.0–100.0)
Platelets: 276 10*3/uL (ref 150–400)
RBC: 3.76 MIL/uL — ABNORMAL LOW (ref 3.87–5.11)
RDW: 13.5 % (ref 11.5–15.5)
WBC: 11.9 10*3/uL — ABNORMAL HIGH (ref 4.0–10.5)
nRBC: 0 % (ref 0.0–0.2)

## 2022-05-30 LAB — TYPE AND SCREEN
ABO/RH(D): O POS
Antibody Screen: NEGATIVE

## 2022-05-30 MED ORDER — IBUPROFEN 600 MG PO TABS
600.0000 mg | ORAL_TABLET | Freq: Four times a day (QID) | ORAL | Status: DC
  Administered 2022-05-30 – 2022-06-01 (×7): 600 mg via ORAL
  Filled 2022-05-30 (×8): qty 1

## 2022-05-30 MED ORDER — DIPHENHYDRAMINE HCL 50 MG/ML IJ SOLN: 12.5000 mg | INTRAMUSCULAR | Status: DC | PRN

## 2022-05-30 MED ORDER — LIDOCAINE HCL (PF) 1 % IJ SOLN: 30.0000 mL | INTRAMUSCULAR | Status: DC | PRN

## 2022-05-30 MED ORDER — OXYTOCIN-SODIUM CHLORIDE 30-0.9 UT/500ML-% IV SOLN
1.0000 m[IU]/min | INTRAVENOUS | Status: DC
  Administered 2022-05-30: 2 m[IU]/min via INTRAVENOUS

## 2022-05-30 MED ORDER — PRENATAL MULTIVITAMIN CH
1.0000 | ORAL_TABLET | Freq: Every day | ORAL | Status: DC
  Administered 2022-05-31: 1 via ORAL
  Filled 2022-05-30 (×2): qty 1

## 2022-05-30 MED ORDER — ACETAMINOPHEN 325 MG PO TABS: 650.0000 mg | ORAL_TABLET | ORAL | Status: DC | PRN

## 2022-05-30 MED ORDER — DIBUCAINE (PERIANAL) 1 % EX OINT: 1.0000 "application " | TOPICAL_OINTMENT | CUTANEOUS | Status: DC | PRN

## 2022-05-30 MED ORDER — OXYCODONE-ACETAMINOPHEN 5-325 MG PO TABS: 1.0000 | ORAL_TABLET | ORAL | Status: DC | PRN

## 2022-05-30 MED ORDER — ZOLPIDEM TARTRATE 5 MG PO TABS: 5.0000 mg | ORAL_TABLET | Freq: Every evening | ORAL | Status: DC | PRN

## 2022-05-30 MED ORDER — ONDANSETRON HCL 4 MG/2ML IJ SOLN: 4.0000 mg | INTRAMUSCULAR | Status: DC | PRN

## 2022-05-30 MED ORDER — WITCH HAZEL-GLYCERIN EX PADS: 1.0000 "application " | MEDICATED_PAD | CUTANEOUS | Status: DC | PRN

## 2022-05-30 MED ORDER — LACTATED RINGERS IV SOLN
500.0000 mL | INTRAVENOUS | Status: DC | PRN
  Administered 2022-05-30: 500 mL via INTRAVENOUS

## 2022-05-30 MED ORDER — LACTATED RINGERS IV SOLN: INTRAVENOUS | Status: DC

## 2022-05-30 MED ORDER — LIDOCAINE HCL (PF) 1 % IJ SOLN
INTRAMUSCULAR | Status: DC | PRN
  Administered 2022-05-30: 8 mL via EPIDURAL

## 2022-05-30 MED ORDER — ACETAMINOPHEN 325 MG PO TABS
650.0000 mg | ORAL_TABLET | ORAL | Status: DC | PRN
  Administered 2022-05-30 – 2022-05-31 (×2): 650 mg via ORAL
  Filled 2022-05-30 (×2): qty 2

## 2022-05-30 MED ORDER — TRANEXAMIC ACID-NACL 1000-0.7 MG/100ML-% IV SOLN
INTRAVENOUS | Status: AC
  Filled 2022-05-30: qty 100

## 2022-05-30 MED ORDER — LACTATED RINGERS IV SOLN
500.0000 mL | Freq: Once | INTRAVENOUS | Status: AC
  Administered 2022-05-30: 500 mL via INTRAVENOUS

## 2022-05-30 MED ORDER — TETANUS-DIPHTH-ACELL PERTUSSIS 5-2.5-18.5 LF-MCG/0.5 IM SUSY: 0.5000 mL | PREFILLED_SYRINGE | Freq: Once | INTRAMUSCULAR | Status: DC

## 2022-05-30 MED ORDER — SOD CITRATE-CITRIC ACID 500-334 MG/5ML PO SOLN: 30.0000 mL | ORAL | Status: DC | PRN

## 2022-05-30 MED ORDER — INSULIN ASPART 100 UNIT/ML IJ SOLN
0.0000 [IU] | INTRAMUSCULAR | Status: DC
  Administered 2022-05-30: 1 [IU] via SUBCUTANEOUS

## 2022-05-30 MED ORDER — MISOPROSTOL 50MCG HALF TABLET: 50.0000 ug | ORAL_TABLET | ORAL | Status: DC | PRN

## 2022-05-30 MED ORDER — TRANEXAMIC ACID-NACL 1000-0.7 MG/100ML-% IV SOLN
1000.0000 mg | INTRAVENOUS | Status: AC
  Administered 2022-05-30: 1000 mg via INTRAVENOUS

## 2022-05-30 MED ORDER — DIPHENHYDRAMINE HCL 50 MG/ML IJ SOLN
12.5000 mg | INTRAMUSCULAR | Status: DC | PRN
  Administered 2022-05-30: 12.5 mg via INTRAVENOUS
  Filled 2022-05-30: qty 1

## 2022-05-30 MED ORDER — BENZOCAINE-MENTHOL 20-0.5 % EX AERO
1.0000 "application " | INHALATION_SPRAY | CUTANEOUS | Status: DC | PRN
  Administered 2022-05-30: 1 via TOPICAL
  Filled 2022-05-30: qty 56

## 2022-05-30 MED ORDER — INSULIN ASPART 100 UNIT/ML IJ SOLN
0.0000 [IU] | INTRAMUSCULAR | Status: DC
  Administered 2022-05-30: 3 [IU] via SUBCUTANEOUS

## 2022-05-30 MED ORDER — OXYTOCIN BOLUS FROM INFUSION
333.0000 mL | Freq: Once | INTRAVENOUS | Status: AC
  Administered 2022-05-30: 333 mL via INTRAVENOUS

## 2022-05-30 MED ORDER — OXYCODONE-ACETAMINOPHEN 5-325 MG PO TABS: 2.0000 | ORAL_TABLET | ORAL | Status: DC | PRN

## 2022-05-30 MED ORDER — PHENYLEPHRINE 80 MCG/ML (10ML) SYRINGE FOR IV PUSH (FOR BLOOD PRESSURE SUPPORT): 80.0000 ug | PREFILLED_SYRINGE | INTRAVENOUS | Status: DC | PRN

## 2022-05-30 MED ORDER — COCONUT OIL OIL: 1.0000 "application " | TOPICAL_OIL | Status: DC | PRN

## 2022-05-30 MED ORDER — DIPHENHYDRAMINE HCL 25 MG PO CAPS: 25.0000 mg | ORAL_CAPSULE | Freq: Four times a day (QID) | ORAL | Status: DC | PRN

## 2022-05-30 MED ORDER — FENTANYL-BUPIVACAINE-NACL 0.5-0.125-0.9 MG/250ML-% EP SOLN
12.0000 mL/h | EPIDURAL | Status: DC | PRN
  Administered 2022-05-30: 12 mL/h via EPIDURAL
  Filled 2022-05-30: qty 250

## 2022-05-30 MED ORDER — FENTANYL-BUPIVACAINE-NACL 0.5-0.125-0.9 MG/250ML-% EP SOLN: 12.0000 mL/h | EPIDURAL | Status: DC | PRN

## 2022-05-30 MED ORDER — ONDANSETRON HCL 4 MG/2ML IJ SOLN
4.0000 mg | Freq: Four times a day (QID) | INTRAMUSCULAR | Status: DC | PRN
  Administered 2022-05-30 (×2): 4 mg via INTRAVENOUS
  Filled 2022-05-30 (×2): qty 2

## 2022-05-30 MED ORDER — SIMETHICONE 80 MG PO CHEW: 80.0000 mg | CHEWABLE_TABLET | ORAL | Status: DC | PRN

## 2022-05-30 MED ORDER — TERBUTALINE SULFATE 1 MG/ML IJ SOLN: 0.2500 mg | Freq: Once | INTRAMUSCULAR | Status: DC | PRN

## 2022-05-30 MED ORDER — EPHEDRINE 5 MG/ML INJ: 10.0000 mg | INTRAVENOUS | Status: DC | PRN

## 2022-05-30 MED ORDER — SENNOSIDES-DOCUSATE SODIUM 8.6-50 MG PO TABS
2.0000 | ORAL_TABLET | Freq: Every day | ORAL | Status: DC
  Administered 2022-05-31 – 2022-06-01 (×2): 2 via ORAL
  Filled 2022-05-30 (×2): qty 2

## 2022-05-30 MED ORDER — ONDANSETRON HCL 4 MG PO TABS: 4.0000 mg | ORAL_TABLET | ORAL | Status: DC | PRN

## 2022-05-30 MED ORDER — OXYTOCIN-SODIUM CHLORIDE 30-0.9 UT/500ML-% IV SOLN
2.5000 [IU]/h | INTRAVENOUS | Status: DC
  Filled 2022-05-30: qty 500

## 2022-05-30 NOTE — Anesthesia Postprocedure Evaluation (Signed)
Anesthesia Post Note  Patient: Emily [name]  Procedure(s) Performed: AN AD HOC LABOR EPIDURAL     Patient location during evaluation: Mother Baby Anesthesia Type: Epidural Level of consciousness: awake and alert Pain management: pain level controlled Vital Signs Assessment: post-procedure vital signs reviewed and stable Respiratory status: spontaneous breathing, nonlabored ventilation and respiratory function stable Cardiovascular status: stable Postop Assessment: no headache, no backache and epidural receding Anesthetic complications: no   No notable events documented.  Last Vitals:  Vitals:   05/30/22 1140 05/30/22 1228  BP: 109/68 122/80  Pulse: 86 81  Resp:  17  Temp:  36.7 C  SpO2:      Last Pain:  Vitals:   05/30/22 1228  TempSrc: Oral  PainSc: 0-No pain   Pain Goal:                   Ishmeal Rorie

## 2022-05-30 NOTE — H&P (Signed)
OBSTETRIC ADMISSION HISTORY AND PHYSICAL  Emily [name] is a 36 y.o. female G3P2002 with IUP at [redacted]w[redacted]d by early [place] presenting for IOL for A2GDM (suspect poorly controlled). She reports +FMs, No LOF, no VB, no blurry vision, headaches or peripheral edema, and RUQ pain.  She plans on breast feeding. She request BTL for birth control (signed papers on 5/15) She received her prenatal care at [facility]   Dating: By early [place] --->  Estimated Date of Delivery: 06/14/22  Sono:   @[redacted]w[redacted]d , CWD, normal anatomy, cephalic presentation, posterior placental lie, 2849g, 51% EFW   Prenatal History/Complications:  GDM on Metformin Hx of breast reduction Low lying placenta - resolved Past Medical History: Past Medical History:  Diagnosis Date   Gestational diabetes     Past Surgical History: Past Surgical History:  Procedure Laterality Date   BREAST REDUCTION SURGERY  08/04/2020   CHOLECYSTECTOMY      Obstetrical History: OB History     Gravida  3   Para  2   Term  2   Preterm      AB      Living  2      SAB      IAB      Ectopic      Multiple      Live Births  2           Social History Social History   Socioeconomic History   Marital status: Single    Spouse name: Not on file   Number of children: Not on file   Years of education: Not on file   Highest education level: Not on file  Occupational History   Not on file  Tobacco Use   Smoking status: Never   Smokeless tobacco: Never  Vaping Use   Vaping Use: Never used  Substance and Sexual Activity   Alcohol use: Not Currently   Drug use: Never   Sexual activity: Yes  Other Topics Concern   Not on file  Social History Narrative   Not on file   Social Determinants of Health   Financial Resource Strain: Not on file  Food Insecurity: No Food Insecurity (11/29/2021)   Hunger Vital Sign    Worried About Running Out of Food in the Last Year: Never true    Ran Out of Food in the Last Year: Never true   Transportation Needs: No Transportation Needs (11/29/2021)   PRAPARE - Hydrologist (Medical): No    Lack of Transportation (Non-Medical): No  Physical Activity: Not on file  Stress: Not on file  Social Connections: Not on file    Family History: Family History  Problem Relation Age of Onset   Hypertension Mother    Diabetes Mother    Stroke Father    Kidney disease Father    Hypertension Father    Diabetes Father    Cancer Maternal Grandmother    Diabetes Maternal Grandmother    Diabetes Paternal Grandmother    Heart disease Neg Hx     Allergies: No Known Allergies  No medications prior to admission.     Review of Systems   All systems reviewed and negative except as stated in HPI  Blood pressure 124/80, pulse 94, temperature 98.3 F (36.8 C), resp. rate 17, height 5' (1.524 m), weight 74.1 kg, last menstrual period 09/01/2021, SpO2 97 %. General appearance: alert Lungs: clear to auscultation bilaterally Heart: regular rate and rhythm Abdomen: soft, non-tender; bowel sounds  normal Extremities: Homans sign is negative, no sign of DVT Presentation: cephalic Fetal monitoringBaseline: 135 bpm, Variability: Good {> 6 bpm), Accelerations: Reactive, and Decelerations: Absent Uterine activity:3-4 mins Dilation: 3.5 Effacement (%): 50 Station: -3 Exam by:: Laury Deep CNM   Prenatal labs: ABO, Rh: O/Positive/-- (12/13 1100) Antibody: Negative (12/13 1100) Rubella: 1.09 (12/13 1100) RPR: Non Reactive (04/17 0825)  HBsAg: Negative (12/13 1100)  HIV: Non Reactive (04/17 0825)  GBS: Negative/-- (06/05 1528)  2 hr Glucola abnormal Genetic screening  declined Anatomy US initially with low lying placenta, then resolved  Prenatal Transfer Tool  Maternal Diabetes: Yes:  Diabetes Type:  Insulin/Medication controlled Genetic Screening: Declined Maternal Ultrasounds/Referrals: Normal Fetal Ultrasounds or other Referrals:  None Maternal  Substance Abuse:  No Significant Maternal Medications:  Meds include: Other: Metformin Significant Maternal Lab Results: Group B Strep negative  No results found for this or any previous visit (from the past 24 hour(s)).  Patient Active Problem List   Diagnosis Date Noted   GDM (gestational diabetes mellitus) 04/04/2022   History of gestational diabetes 04/03/2022   Low lying placenta nos or without hemorrhage, second trimester 01/30/2022   Family history of cleft palate 01/22/2022   S/P bilateral breast reduction 11/29/2021   Supervision of high risk pregnancy, antepartum 11/15/2021    Assessment/Plan:  Emily [name] is a 36 y.o. G3P2002 at [redacted]w[redacted]d here for IOL for A2GDM (concern for poor control given elevated readings)  #Labor: Patient came to [facility] for labor evaluation ~6 hours ago and was found to not be in labor. Since she had an IOL scheduled at [facility] an FB was placed. Currently balloon is now out and contractions are infrequent. Patient requests some time to rest. We discussed that since she is here for IOL we recommend getting the process started. We discussed AROM and pitocin as options. We discussed once AROMed she could progress quickly given she is a multip with two prior vaginal deliveries. She reports she would like to start with pitocin at this time. Will start pitocin 2x2 and assess and plan for AROM in 2 hours. #Pain: Plans epidural #FWB: Cat I #ID:  GBS neg #MOF: breast #MOC:BTL (papers signed 5/15) #Circ:  Yes  #A2GDM On Metformin however her readings were above goal (although not fasting and snacking often). Plan to IOL now.  Patient ate around midnight (sonic) and checked BG at it was in 200s (on her home monitor). Will plan to recheck in 1 hour and then q4hr checks and switch to q2hr when more active - SSI if needed  Renard Matter, MD  05/29/2022, 11:57 PM

## 2022-05-30 NOTE — Progress Notes (Signed)
Pt ate around midnight. Pt checked her CBG around 0130 on her own machine. It resulted as 203. Will recheck CBG at 0300 per orders

## 2022-05-30 NOTE — Anesthesia Procedure Notes (Signed)
Epidural Patient location during procedure: OB Start time: 05/30/2022 5:26 AM End time: 05/30/2022 5:33 AM  Staffing Anesthesiologist: Bethena Midget, MD  Preanesthetic Checklist Completed: patient identified, IV checked, site marked, risks and benefits discussed, surgical consent, monitors and equipment checked, pre-op evaluation and timeout performed  Epidural Patient position: sitting Prep: DuraPrep and site prepped and draped Patient monitoring: continuous pulse ox and blood pressure Approach: midline Location: L3-L4 Injection technique: LOR air  Needle:  Needle type: Tuohy  Needle gauge: 17 G Needle length: 9 cm and 9 Needle insertion depth: 5 cm Catheter type: closed end flexible Catheter size: 19 Gauge Catheter at skin depth: 10 cm Test dose: negative  Assessment Events: blood not aspirated, injection not painful, no injection resistance, no paresthesia and negative IV test

## 2022-05-30 NOTE — Inpatient Diabetes Management (Signed)
Inpatient Diabetes Program Recommendations  Diabetes Treatment Program Recommendations  ADA Standards of Care Diabetes in Pregnancy Target Glucose Ranges:  Fasting: 70 - 95 mg/dL 1 hr postprandial: Less than 140mg /dL (from first bite of meal) 2 hr postprandial: Less than 120 mg/dL (from first bite of meal)     Latest Reference Range & Units 05/30/22 03:07 05/30/22 06:56  Glucose-Capillary 70 - 99 mg/dL 06/01/22 (H) 92   Review of Glycemic Control  Diabetes history: GDM; [redacted]W[redacted]D Outpatient Diabetes medications: Metformin 1500 mg BID Current orders for Inpatient glycemic control: Novolog 0-14 units Q2H   NOTE: Patient has hx of GDM and takes Metformin outpatient for DM control;  admitted for IOL. Initial glucose 178 mg/dl on 993 at 07/03/95 am. Currently ordered Novolog correction Q2H. Agree with current inpatient orders for glycemic control. Anticipate glucose to return to baseline after delivery. Inpatient diabetes team will follow along while inpatient.   Thanks, 7:89, RN, MSN, CDCES Diabetes Coordinator Inpatient Diabetes Program [phone] (Team Pager from 8am to 5pm)

## 2022-05-30 NOTE — Anesthesia Preprocedure Evaluation (Signed)
Anesthesia Evaluation  Patient identified by MRN, date of birth, ID band Patient awake    Reviewed: Allergy & Precautions, H&P , NPO status , Patient's Chart, lab work & pertinent test results, reviewed documented beta blocker date and time   Airway Mallampati: I  TM Distance: >3 FB Neck ROM: full    Dental no notable dental hx. (+) Teeth Intact, Dental Advisory Given   Pulmonary neg pulmonary ROS,    Pulmonary exam normal breath sounds clear to auscultation       Cardiovascular negative cardio ROS Normal cardiovascular exam Rhythm:regular Rate:Normal     Neuro/Psych negative neurological ROS  negative psych ROS   GI/Hepatic negative GI ROS, Neg liver ROS,   Endo/Other  negative endocrine ROSdiabetes  Renal/GU negative Renal ROS  negative genitourinary   Musculoskeletal   Abdominal   Peds  Hematology negative hematology ROS (+)   Anesthesia Other Findings   Reproductive/Obstetrics (+) Pregnancy                             Anesthesia Physical Anesthesia Plan  ASA: 2  Anesthesia Plan: Epidural   Post-op Pain Management:    Induction:   PONV Risk Score and Plan:   Airway Management Planned: Natural Airway  Additional Equipment: None  Intra-op Plan:   Post-operative Plan:   Informed Consent: I have reviewed the patients History and Physical, chart, labs and discussed the procedure including the risks, benefits and alternatives for the proposed anesthesia with the patient or authorized representative who has indicated his/her understanding and acceptance.     Dental Advisory Given  Plan Discussed with: Anesthesiologist  Anesthesia Plan Comments: (Labs checked- platelets confirmed with RN in room. Fetal heart tracing, per RN, reported to be stable enough for sitting procedure. Discussed epidural, and patient consents to the procedure:  included risk of possible  headache,backache, failed block, allergic reaction, and nerve injury. This patient was asked if she had any questions or concerns before the procedure started.)        Anesthesia Quick Evaluation

## 2022-05-30 NOTE — Lactation Note (Addendum)
This note was copied from a baby's chart. Lactation Consultation Note  Patient Name: Emily [name] MBTDH'R Date: 05/30/2022   Age:35 hours  Dad bottle fed infant for last feeding. [facility]. Mom to call for latch assistance with next feeding.   Infant checked in with RN to see if good time to assist with breastfeeding. Infant having some emesis at the time. LC to follow up later.   Maternal Data    Feeding    LATCH Score Latch: Too sleepy or reluctant, no latch achieved, no sucking elicited.  Audible Swallowing: A few with stimulation  Type of Nipple: Flat  Comfort (Breast/Nipple): Soft / non-tender  Hold (Positioning): Full assist, staff holds infant at breast  [facility] Score: 4   Lactation Tools Discussed/Used    Interventions    Discharge    Consult Status      Emily Tweten  [name] 05/30/2022, 1:54 PM

## 2022-05-30 NOTE — Progress Notes (Signed)
Labor Progress Note Emily [name] is a 36 y.o. G3P2002 at [redacted]w[redacted]d presented for IOL for A2GDM (on Metformin) S:  Reports she feels comfortable after epidural  O:  BP 116/77   Pulse 79   Temp 98.1 F (36.7 C) (Oral)   Resp 18   Ht 4\' 11"  (1.499 m)   Wt 74.3 kg   LMP 09/01/2021 (Approximate)   BMI 33.10 kg/m  EFM: 130/moderate variability/+accels/no decels  CVE: Dilation: 5 Effacement (%): 50 Station: -2 Presentation: Vertex Exam by:: Renard Matter, MD   A&P: 36 y.o.  G3P2002 at [redacted]w[redacted]d presented for IOL for A2GDM (on Metformin) #Labor: Progressing well. Cervix now 5 cm. Possible SROM. During check forebag palpated. Offered AROM and patient amenable. AROM done and large amount of clear fluid dispelled. Tolerated well by patient and fetus. Will continue pitocin at this time. Discussed with patient to call out if having rectal pressure.  #Pain: epidural placed #FWB: Cat I #GBS negative  #A2GDM Switch to Q2hr checks. SSI  Renard Matter, MD, MPH OB Fellow, Faculty Practice

## 2022-05-30 NOTE — Discharge Summary (Signed)
Postpartum Discharge Summary  Date of Service updated6/15     Patient Name: Emily [name] DOB: 02-20-1986 MRN: [id number]  Date of admission: 05/30/2022 Delivery date:05/30/2022  Delivering provider: Renee Harder  Date of discharge: 06/01/2022  Admitting diagnosis: Gestational diabetes mellitus (GDM) controlled on oral hypoglycemic drug [O24.415] Intrauterine pregnancy: [redacted]w[redacted]d    Secondary diagnosis:  Principal Problem:   Gestational diabetes mellitus (GDM) controlled on oral hypoglycemic drug Active Problems:   Supervision of high risk pregnancy, antepartum   S/P bilateral breast reduction   SVD (spontaneous vaginal delivery)  Additional problems: Desires permanent sterilization    Discharge diagnosis: Term Pregnancy Delivered and GDM A2                                              Post partum procedures:postpartum tubal ligation Augmentation: AROM, Pitocin, and OP Foley Complications: None  Hospital course: Induction of Labor With Vaginal Delivery   36y.o. yo G3P2002 at 366w6das admitted to the hospital 05/30/2022 for induction of labor.  Indication for induction: A2 DM.  Patient had an uncomplicated labor course as follows: Membrane Rupture Time/Date: 6:15 AM ,05/30/2022   Delivery Method:Vaginal, Spontaneous  Episiotomy: None  Lacerations:  1st degree  Details of delivery can be found in separate delivery note.  Patient had a routine postpartum course. She underwent a postpartum tubal ligation on PP#2, see separate operative note. Patient is discharged home 06/01/22.  Newborn Data: Birth date:05/30/2022  Birth time:9:12 AM  Gender:Female  Living status:Living  Apgars:8 ,9  Weight:3190 g   Magnesium Sulfate received: No BMZ received: No Rhophylac:N/A MMR:N/A T-DaP:Given prenatally Flu: N/A Transfusion:No  Physical exam  Vitals:   06/01/22 1345 06/01/22 1400 06/01/22 1415 06/01/22 1445  BP: 105/70 120/82 129/82 (!) 147/86  Pulse: (!) 57 66 78 76  Resp:  '17  19 16  ' Temp: 97.9 F (36.6 C)   97.7 F (36.5 C)  TempSrc: Oral   Oral  SpO2: 96% 98% 100% 100%  Weight:      Height:       General: alert, cooperative, and no distress Lochia: appropriate Uterine Fundus: firm Incision: Dressing is clean, dry, and intact DVT Evaluation: No evidence of DVT seen on physical exam. Labs: Lab Results  Component Value Date   WBC 11.9 (H) 05/30/2022   HGB 10.9 (L) 05/30/2022   HCT 32.2 (L) 05/30/2022   MCV 85.6 05/30/2022   PLT 276 05/30/2022      Latest Ref Rng & Units 04/14/2022    8:38 AM  CMP  Glucose 70 - 99 mg/dL 186   BUN 6 - 20 mg/dL 7   Creatinine 0.44 - 1.00 mg/dL 0.56   Sodium 135 - 145 mmol/L 135   Potassium 3.5 - 5.1 mmol/L 4.1   Chloride 98 - 111 mmol/L 108   CO2 22 - 32 mmol/L 18   Calcium 8.9 - 10.3 mg/dL 8.9   Total Protein 6.5 - 8.1 g/dL 6.0   Total Bilirubin 0.3 - 1.2 mg/dL 0.4   Alkaline Phos 38 - 126 U/L 75   AST 15 - 41 U/L 19   ALT 0 - 44 U/L 15    Edinburgh Score:    05/30/2022   12:28 PM  Edinburgh Postnatal Depression Scale Screening Tool  I have been able to laugh and see the funny side  of things. 0  I have looked forward with enjoyment to things. 0  I have blamed myself unnecessarily when things went wrong. 0  I have been anxious or worried for no good reason. 0  I have felt scared or panicky for no good reason. 0  Things have been getting on top of me. 0  I have been so unhappy that I have had difficulty sleeping. 0  I have felt sad or miserable. 0  I have been so unhappy that I have been crying. 0  The thought of harming myself has occurred to me. 0  Edinburgh Postnatal Depression Scale Total 0     After visit meds:  Allergies as of 06/01/2022   No Known Allergies      Medication List     STOP taking these medications    aspirin 81 MG chewable tablet   metFORMIN 1000 MG tablet Commonly known as: GLUCOPHAGE       TAKE these medications    Accu-Chek Guide test strip Generic drug:  glucose blood Use as instructed   Accu-Chek Guide w/Device Kit 1 Device by Does not apply route as needed.   Accu-Chek Softclix Lancets lancets Use as instructed   acetaminophen 325 MG tablet Commonly known as: Tylenol Take 2 tablets (650 mg total) by mouth every 4 (four) hours as needed (for pain scale < 4).   Blood Pressure Kit Devi 1 Device by Does not apply route once a week.   gabapentin 300 MG capsule Commonly known as: Neurontin Take 1 capsule (300 mg total) by mouth 3 (three) times daily for 3 days.   ibuprofen 600 MG tablet Commonly known as: ADVIL Take 1 tablet (600 mg total) by mouth every 6 (six) hours as needed.   ondansetron 4 MG disintegrating tablet Commonly known as: ZOFRAN-ODT Take 1 tablet (4 mg total) by mouth every 8 (eight) hours as needed for nausea or vomiting.   ondansetron 4 MG tablet Commonly known as: Zofran Take 1 tablet (4 mg total) by mouth every 8 (eight) hours as needed for nausea or vomiting.   oxyCODONE 5 MG immediate release tablet Commonly known as: Oxy IR/ROXICODONE Take 1 tablet (5 mg total) by mouth every 6 (six) hours as needed for up to 5 days for severe pain.   PrePLUS 27-1 MG Tabs Take 1 tablet by mouth daily.   zolpidem 5 MG tablet Commonly known as: Ambien Take 1 tablet (5 mg total) by mouth at bedtime as needed for sleep.         Discharge home in stable condition Infant Feeding: Bottle Infant Disposition:home with mother Discharge instruction: per After Visit Summary and Postpartum booklet. Activity: Advance as tolerated. Pelvic rest for 6 weeks.  Diet: routine diet Future Appointments: Future Appointments  Date Time Provider [place]  07/10/2022  8:15 AM Danielle Rankin [facility]  07/10/2022  8:50 AM [facility] [place]   Follow up Visit:  [place] for [facility]. Schedule an appointment as soon as possible for a visit  in 1 week(s).   Specialty: Obstetrics and Gynecology Why: Please follow up in one week for an incision check Contact information: [place] [address] [phone]                Message sent to [facility] on 6/13 by Maryagnes Amos, CNM  Please schedule this patient for a In person postpartum visit  in 6 weeks with the following provider: Any provider. Additional Postpartum F/U:2 hour GTT  High risk pregnancy complicated by: GDM Delivery mode:  Vaginal, Spontaneous  Anticipated Birth Control:  BTL done [facility]   06/01/2022 Annalee Genta, DO

## 2022-05-30 NOTE — Lactation Note (Signed)
This note was copied from a baby's chart. Lactation Consultation Note  Patient Name: Emily [name] CVELF'Y Date: 05/30/2022 Reason for consult: Initial assessment;Early term 37-38.6wks;Breast reduction (Per mom, she had her breast reduction 18 months ago.) Age:35 hours Mom is very tired , mom was doing skin to skin with infant.  Mom would like to be set up in morning with DEBP due to past hx of low milk supply and breast reduction recently in the  past 18 months.  Infant has been spitty mom wants to formula feed infant only tonight and work on latching infant at breast in morning. Mom will continue to feed infant according hunger cues, on demand, 8 to 12 times within 24 hours. Mom made aware of O/P services, breastfeeding support groups, community resources, and our phone # for post-discharge questions.    Maternal Data Has patient been taught Hand Expression?: Yes Does the patient have breastfeeding experience prior to this delivery?: Yes How long did the patient breastfeed?: Per mom, she BF her 2nd child for 2 months but had low milk supply she supplemented with fomula.  Feeding Mother's Current Feeding Choice: Breast Milk and Formula  LATCH Score                    Lactation Tools Discussed/Used    Interventions Interventions: Breast feeding basics reviewed;Skin to skin;Education;DEBP;LC Services brochure  Discharge Pump: Personal (Medela DEBP) WIC Program: No  Consult Status Consult Status: Follow-up Date: 05/31/22 Follow-up type: In-patient    Danelle Earthly 05/30/2022, 10:19 PM

## 2022-05-30 NOTE — Progress Notes (Signed)
Assessed patient at bedside  Reports she is starting to feel more discomfort with her contractions. Also needing to urinate often. Reports she rates contractions 5/10   We discussed AROM at this time. Patient would like to hold off. Patient reports she is not ready for her epidural quite yet and she would also like to wait for AROM.   Will plan to reassess in 2 hours for AROM. Discussed with patient that she can get her epidural whenever she is ready.  Warner Mccreedy, MD, MPH OB Fellow, Faculty Practice

## 2022-05-31 MED ORDER — METOCLOPRAMIDE HCL 10 MG PO TABS
10.0000 mg | ORAL_TABLET | Freq: Once | ORAL | Status: AC
  Administered 2022-06-01: 10 mg via ORAL
  Filled 2022-05-31: qty 1

## 2022-05-31 MED ORDER — FAMOTIDINE 20 MG PO TABS
40.0000 mg | ORAL_TABLET | Freq: Once | ORAL | Status: AC
  Administered 2022-06-01: 40 mg via ORAL
  Filled 2022-05-31: qty 2

## 2022-05-31 MED ORDER — LACTATED RINGERS IV SOLN: INTRAVENOUS | Status: DC

## 2022-05-31 NOTE — Social Work (Addendum)
MOB was referred for history of anxiety.  * Referral screened out by Clinical Social Worker because none of the following criteria appear to apply: ~ History of anxiety/depression during this pregnancy, or of post-partum depression following prior delivery. ~ Diagnosis of anxiety and/or depression within last 3 years OR * MOB's symptoms currently being treated with medication and/or therapy. Per chart review, MOB treats anxiety symptoms with Zoloft.   Please contact the Clinical Social Worker if needs arise, by [facility] request, or if MOB scores greater than 9/yes to question 10 on Edinburgh Postpartum Depression Screen.   Kathrin Greathouse, MSW, LCSW [facility] [place] Worker  [phone] 05/31/2022  8:37 AM

## 2022-05-31 NOTE — Lactation Note (Signed)
This note was copied from a baby's chart. Lactation Consultation Note  Patient Name: Emily [name] [id number] Date: 05/31/2022 Reason for consult: Follow-up assessment;Mother's request;Difficult latch;Early term 37-38.6wks;Maternal endocrine disorder;RN request Age:35 hours  LC assisted with latch in cross cradle prone with increase in depth of swallows noted with breast compression. Infant can bring tongue pass the gum line but does not elevate tongue.   Plan 1. To feed based on cues 8-12x 24hr period. Mom to latch at the breast and look for signs of milk transfer.  2. Mom to supplement with EBM first followed by formula with pace bottle feeding and slow flow nipple. BF supplementation guide provided.  3. Post pump after each feeding for 15 mins on initial setting.   All questions answered at the end of the visit.   Mom medela pump for home, as well.   Maternal Data    Feeding Mother's Current Feeding Choice: Breast Milk and Formula Nipple Type: Slow - flow  LATCH Score Latch: Repeated attempts needed to sustain latch, nipple held in mouth throughout feeding, stimulation needed to elicit sucking reflex.  Audible Swallowing: A few with stimulation  Type of Nipple: Everted at rest and after stimulation  Comfort (Breast/Nipple): Soft / non-tender  Hold (Positioning): Assistance needed to correctly position infant at breast and maintain latch.  LATCH Score: 7   Lactation Tools Discussed/Used Tools: Pump;Flanges Flange Size: 24 Breast pump type: Double-Electric Breast Pump Pump Education: Setup, frequency, and cleaning;Milk Storage Reason for Pumping: increase stimulation Pumping frequency: post pump after breast feeding on initial setting for 15 min  Interventions Interventions: Breast feeding basics reviewed;Assisted with latch;Skin to skin;Breast massage;Hand express;Breast compression;Adjust position;Support pillows;Position options;Expressed milk;DEBP;Education;Pace  feeding;LC Magazine features editor;Infant Driven Feeding Algorithm education  Discharge Pump: DEBP;Personal  Consult Status Consult Status: Follow-up Date: 06/01/22 Follow-up type: In-patient    Elnoria Livingston  Nicholson-Springer 05/31/2022, 2:50 PM

## 2022-05-31 NOTE — Progress Notes (Signed)
POSTPARTUM PROGRESS NOTE  Subjective: Emily [name] is a 36 y.o. G3P3003 PPD#1 s/p SVD at [redacted]w[redacted]d.  She reports she doing well. No acute events overnight. She denies any problems with ambulating, voiding or po intake. Denies nausea or vomiting. She has  passed flatus. Pain is well controlled.  Lochia is scant.  Objective: Blood pressure 127/79, pulse 89, temperature 98.5 F (36.9 C), temperature source Oral, resp. rate 17, height 4\' 11"  (1.499 m), weight 74.3 kg, last menstrual period 09/01/2021, SpO2 100 %  Physical Exam:  General: alert, cooperative and no distress Chest: no respiratory distress Abdomen: soft, non-tender  Uterine Fundus: firm, appropriately tender Extremities: No calf swelling or tenderness  no edema  Recent Labs    05/30/22 0034  HGB 10.9*  HCT 32.2*    Assessment/Plan: Emily [name] is a 36 y.o. G3P3003 s/p  PPD#1 s/p SVD at [redacted]w[redacted]d.  Routine Postpartum Care: Doing well, pain well-controlled.  -- Continue routine care, lactation support  -- Contraception: BTL scheduled on 6/15 (PPD#2) (will be 31 days since consent on 5/15) -- Feeding: breast  #A2GDM On Metformin during pregnancy. FBG pending today.  Dispo: Plan for discharge PPD#2.  6/15, MD, MPH OB Fellow, [facility] for [facility]

## 2022-06-01 ENCOUNTER — Other Ambulatory Visit: Payer: Self-pay

## 2022-06-01 ENCOUNTER — Inpatient Hospital Stay (HOSPITAL_COMMUNITY): Payer: Medicaid Other

## 2022-06-01 ENCOUNTER — Inpatient Hospital Stay (HOSPITAL_COMMUNITY): Payer: Medicaid Other | Admitting: Anesthesiology

## 2022-06-01 ENCOUNTER — Encounter (HOSPITAL_COMMUNITY)
Admission: AD | Disposition: A | Discharge status: Home / Self Care | Payer: Self-pay | Source: Home / Self Care | Attending: Family Medicine

## 2022-06-01 ENCOUNTER — Ambulatory Visit: Payer: Medicaid Other

## 2022-06-01 DIAGNOSIS — Z302 Encounter for sterilization: Secondary | ICD-10-CM

## 2022-06-01 HISTORY — PX: TUBAL LIGATION: SHX77

## 2022-06-01 LAB — GLUCOSE, CAPILLARY: Glucose-Capillary: 99 mg/dL (ref 70–99)

## 2022-06-01 SURGERY — LIGATION, FALLOPIAN TUBE, POSTPARTUM
Anesthesia: Spinal

## 2022-06-01 MED ORDER — FENTANYL CITRATE (PF) 100 MCG/2ML IJ SOLN
INTRAMUSCULAR | Status: DC | PRN
Start: 2022-06-01 — End: 2022-06-01
  Administered 2022-06-01: 20 ug via INTRATHECAL
  Administered 2022-06-01: 80 ug via INTRATHECAL

## 2022-06-01 MED ORDER — ACETAMINOPHEN 10 MG/ML IV SOLN
1000.0000 mg | Freq: Once | INTRAVENOUS | Status: AC
  Administered 2022-06-01: 1000 mg via INTRAVENOUS
  Filled 2022-06-01: qty 100

## 2022-06-01 MED ORDER — IBUPROFEN 600 MG PO TABS: 600.0000 mg | ORAL_TABLET | Freq: Four times a day (QID) | ORAL | 0 refills | Status: DC | PRN

## 2022-06-01 MED ORDER — OXYCODONE HCL 5 MG PO TABS: 5.0000 mg | ORAL_TABLET | Freq: Once | ORAL | Status: DC | PRN

## 2022-06-01 MED ORDER — ONDANSETRON HCL 4 MG PO TABS: 4.0000 mg | ORAL_TABLET | Freq: Three times a day (TID) | ORAL | 0 refills | Status: DC | PRN

## 2022-06-01 MED ORDER — ONDANSETRON HCL 4 MG/2ML IJ SOLN: 4.0000 mg | Freq: Once | INTRAMUSCULAR | Status: DC | PRN

## 2022-06-01 MED ORDER — BUPIVACAINE HCL (PF) 0.25 % IJ SOLN
INTRAMUSCULAR | Status: DC | PRN
  Administered 2022-06-01: 10 mL

## 2022-06-01 MED ORDER — OXYCODONE HCL 5 MG/5ML PO SOLN: 5.0000 mg | Freq: Once | ORAL | Status: DC | PRN

## 2022-06-01 MED ORDER — FENTANYL CITRATE (PF) 100 MCG/2ML IJ SOLN
25.0000 ug | INTRAMUSCULAR | Status: DC | PRN
  Administered 2022-06-01 (×2): 50 ug via INTRAVENOUS
  Filled 2022-06-01: qty 2

## 2022-06-01 MED ORDER — DEXMEDETOMIDINE (PRECEDEX) IN NS 20 MCG/5ML (4 MCG/ML) IV SYRINGE
PREFILLED_SYRINGE | INTRAVENOUS | Status: DC | PRN
  Administered 2022-06-01 (×2): 4 ug via INTRAVENOUS

## 2022-06-01 MED ORDER — FENTANYL CITRATE (PF) 100 MCG/2ML IJ SOLN
INTRAMUSCULAR | Status: AC
  Filled 2022-06-01: qty 2

## 2022-06-01 MED ORDER — MIDAZOLAM HCL 5 MG/5ML IJ SOLN
INTRAMUSCULAR | Status: DC | PRN
  Administered 2022-06-01: 2 mg via INTRAVENOUS

## 2022-06-01 MED ORDER — DEXMEDETOMIDINE HCL IN NACL 80 MCG/20ML IV SOLN
INTRAVENOUS | Status: AC
  Filled 2022-06-01: qty 20

## 2022-06-01 MED ORDER — MEPERIDINE HCL 25 MG/ML IJ SOLN: 6.2500 mg | INTRAMUSCULAR | Status: DC | PRN

## 2022-06-01 MED ORDER — GABAPENTIN 300 MG PO CAPS: 300.0000 mg | ORAL_CAPSULE | Freq: Three times a day (TID) | ORAL | 0 refills | Status: DC

## 2022-06-01 MED ORDER — ACETAMINOPHEN 160 MG/5ML PO SOLN: 325.0000 mg | ORAL | Status: DC | PRN

## 2022-06-01 MED ORDER — BUPIVACAINE HCL (PF) 0.25 % IJ SOLN
INTRAMUSCULAR | Status: AC
  Filled 2022-06-01: qty 10

## 2022-06-01 MED ORDER — ONDANSETRON HCL 4 MG/2ML IJ SOLN
INTRAMUSCULAR | Status: AC
  Filled 2022-06-01: qty 2

## 2022-06-01 MED ORDER — OXYCODONE HCL 5 MG PO TABS: 5.0000 mg | ORAL_TABLET | Freq: Four times a day (QID) | ORAL | 0 refills | Status: AC | PRN

## 2022-06-01 MED ORDER — OXYCODONE HCL 5 MG PO TABS
5.0000 mg | ORAL_TABLET | Freq: Once | ORAL | Status: AC
  Administered 2022-06-01: 5 mg via ORAL
  Filled 2022-06-01: qty 1

## 2022-06-01 MED ORDER — ACETAMINOPHEN 325 MG PO TABS: 325.0000 mg | ORAL_TABLET | ORAL | Status: DC | PRN

## 2022-06-01 MED ORDER — ACETAMINOPHEN 325 MG PO TABS: 650.0000 mg | ORAL_TABLET | ORAL | Status: DC | PRN

## 2022-06-01 MED ORDER — MIDAZOLAM HCL 2 MG/2ML IJ SOLN
INTRAMUSCULAR | Status: AC
  Filled 2022-06-01: qty 2

## 2022-06-01 MED ORDER — ONDANSETRON HCL 4 MG/2ML IJ SOLN
INTRAMUSCULAR | Status: DC | PRN
  Administered 2022-06-01: 4 mg via INTRAVENOUS

## 2022-06-01 MED ORDER — KETOROLAC TROMETHAMINE 30 MG/ML IJ SOLN
30.0000 mg | Freq: Once | INTRAMUSCULAR | Status: AC
  Administered 2022-06-01: 30 mg via INTRAVENOUS
  Filled 2022-06-01: qty 1

## 2022-06-01 MED ORDER — CHLOROPROCAINE HCL 50 MG/5ML IT SOLN
INTRATHECAL | Status: DC | PRN
  Administered 2022-06-01: 40 mL via INTRATHECAL

## 2022-06-01 MED ORDER — FENTANYL CITRATE (PF) 100 MCG/2ML IJ SOLN: 25.0000 ug | INTRAMUSCULAR | Status: DC | PRN

## 2022-06-01 SURGICAL SUPPLY — 31 items
ADH SKN CLS APL DERMABOND .7 (GAUZE/BANDAGES/DRESSINGS) ×1
CLOTH BEACON ORANGE TIMEOUT ST (SAFETY) ×3 IMPLANT
DERMABOND ADVANCED (GAUZE/BANDAGES/DRESSINGS) ×1
DERMABOND ADVANCED .7 DNX12 (GAUZE/BANDAGES/DRESSINGS) ×2 IMPLANT
DRSG OPSITE POSTOP 3X4 (GAUZE/BANDAGES/DRESSINGS) ×3 IMPLANT
ELECT REM PT RETURN 9FT ADLT (ELECTROSURGICAL) ×2
ELECTRODE REM PT RTRN 9FT ADLT (ELECTROSURGICAL) ×2 IMPLANT
GLOVE BIOGEL PI IND STRL 6.5 (GLOVE) ×2 IMPLANT
GLOVE BIOGEL PI IND STRL 7.0 (GLOVE) ×2 IMPLANT
GLOVE BIOGEL PI INDICATOR 6.5 (GLOVE) ×1
GLOVE BIOGEL PI INDICATOR 7.0 (GLOVE) ×1
GLOVE ECLIPSE 6.5 STRL STRAW (GLOVE) ×3 IMPLANT
GOWN STRL REUS W/TWL LRG LVL3 (GOWN DISPOSABLE) ×6 IMPLANT
NEEDLE HYPO 22GX1.5 SAFETY (NEEDLE) ×3 IMPLANT
NS IRRIG 1000ML POUR BTL (IV SOLUTION) ×3 IMPLANT
PACK ABDOMINAL MINOR (CUSTOM PROCEDURE TRAY) ×3 IMPLANT
PENCIL BUTTON HOLSTER BLD 10FT (ELECTRODE) ×3 IMPLANT
PROTECTOR NERVE ULNAR (MISCELLANEOUS) ×3 IMPLANT
SPONGE LAP 4X18 RFD (DISPOSABLE) IMPLANT
SUT PLAIN 0 NONE (SUTURE) ×3 IMPLANT
SUT VIC AB 0 CT1 27 (SUTURE) ×2
SUT VIC AB 0 CT1 27XBRD ANBCTR (SUTURE) ×2 IMPLANT
SUT VIC AB 2-0 SH 27 (SUTURE) ×2
SUT VIC AB 2-0 SH 27XBRD (SUTURE) IMPLANT
SUT VICRYL 4-0 PS2 18IN ABS (SUTURE) ×3 IMPLANT
SYR CONTROL 10ML LL (SYRINGE) ×3 IMPLANT
TOWEL OR 17X24 6PK STRL BLUE (TOWEL DISPOSABLE) ×6 IMPLANT
TRAY FOLEY CATH SILVER 14FR (SET/KITS/TRAYS/PACK) ×3 IMPLANT
TUBING NON-CON 1/4 X 20 CONN (TUBING) ×3 IMPLANT
WATER STERILE IRR 1000ML POUR (IV SOLUTION) ×3 IMPLANT
YANKAUER SUCT BULB TIP NO VENT (SUCTIONS) ×3 IMPLANT

## 2022-06-01 NOTE — Anesthesia Preprocedure Evaluation (Addendum)
Anesthesia Evaluation  Patient identified by MRN, date of birth, ID band Patient awake    Reviewed: Allergy & Precautions, H&P , NPO status , Patient's Chart, lab work & pertinent test results, reviewed documented beta blocker date and time   Airway Mallampati: I  TM Distance: >3 FB Neck ROM: full    Dental no notable dental hx. (+) Teeth Intact, Dental Advisory Given   Pulmonary neg pulmonary ROS,    Pulmonary exam normal breath sounds clear to auscultation       Cardiovascular negative cardio ROS Normal cardiovascular exam Rhythm:regular Rate:Normal     Neuro/Psych negative neurological ROS  negative psych ROS   GI/Hepatic negative GI ROS, Neg liver ROS,   Endo/Other  diabetes, Gestational  Renal/GU negative Renal ROS  negative genitourinary   Musculoskeletal   Abdominal   Peds  Hematology negative hematology ROS (+)   Anesthesia Other Findings   Reproductive/Obstetrics                             Anesthesia Physical  Anesthesia Plan  ASA: 2  Anesthesia Plan: Spinal   Post-op Pain Management: Minimal or no pain anticipated   Induction: Intravenous  PONV Risk Score and Plan: 2  Airway Management Planned: Natural Airway  Additional Equipment: None  Intra-op Plan:   Post-operative Plan:   Informed Consent: I have reviewed the patients History and Physical, chart, labs and discussed the procedure including the risks, benefits and alternatives for the proposed anesthesia with the patient or authorized representative who has indicated his/her understanding and acceptance.     Dental Advisory Given  Plan Discussed with: Anesthesiologist and CRNA  Anesthesia Plan Comments: (  )       Anesthesia Quick Evaluation

## 2022-06-01 NOTE — Addendum Note (Signed)
Addendum  created 06/01/22 0750 by Algis Greenhouse, CRNA   Clinical Note Signed

## 2022-06-01 NOTE — Op Note (Signed)
Operative Note   06/01/2022  PRE-OP DIAGNOSIS: Desire for permanent sterilization.  Postpartum Day #2   POST-OP DIAGNOSIS: same  SURGEON: Surgeon(s) and Role:    * Myna Hidalgo, DO  ASSISTANT: Warner Mccreedy, MD  ANESTHESIA: General and local  PROCEDURE: bilateral salpingectomy  ESTIMATED BLOOD LOSS: 36mL   TOTAL IV FLUIDS: 1000 mL crystalloid  SPECIMENS:  Bilateral tubes to pathology  VTE PROPHYLAXIS: SCDs to the bilateral lower extremities  ANTIBIOTICS: not indicated  COMPLICATIONS: none  DISPOSITION: PACU - hemodynamically stable.  CONDITION: stable  FINDINGS: No intra-abdominal adhesions noted. Smooth, normally contoured uterine fundus and bilateral tubes. Normal appearing tubes and normal ovaries on palpation.   PROCEDURE IN DETAIL: The patient was taken to the OR where anesthesia was administed. A Time Out was held and the above information confirmed.The patient was positioned in dorsal supine. The patient was prepped and draped in the normal sterile fashion a 4-5 cm horizontal incision was made approximately 2 finger breadths below the umbilicus, after injection with local anesthesia. The skin was then incised with the scalpel and the fascia nicked in the midline with scalpel and then extended laterally sharply.  The abdomen was then entered bluntly and a moist lap sponge used to displace the bowel.   Attention was then turned to the fallopian tubes. Bilateral salpingectomy: A Kelly clamp was placed across the right fallopian tube taking care to incorporate the fimbriae. A second clamp was then placed below the first. The fallopian tube was then removed with Metzenbaum scissors. The pedicle was then tied with plain gut and the second clamp was removed with excellent hemostasis noted. Then a second ligature of 0 Vicryl suture was placed below the initial knot, the clamp was then removed and again excellent hemostasis was observed. The same procedure was then carried out on the  left fallopian tube with excellent hemostasis noted.   The lap sponge was then removed from the abdomen and the fascia closed in running fashion with 0 vicryl. The skin was then closed with 4-0 Vicryl. The patient tolerated the procedure well. All counts were correct x 2. The patient was transferred to the recovery room awake, alert and breathing independently.   Warner Mccreedy, MD, MPH OB Fellow, [facility] for [facility])

## 2022-06-01 NOTE — Anesthesia Procedure Notes (Signed)
Spinal  Patient location during procedure: OR Start time: 06/01/2022 11:49 AM End time: 06/01/2022 11:55 AM Reason for block: surgical anesthesia Staffing Anesthesiologist: Bethena Midget, MD Performed by: Bethena Midget, MD Authorized by: Bethena Midget, MD   Preanesthetic Checklist Completed: patient identified, IV checked, site marked, risks and benefits discussed, surgical consent, monitors and equipment checked, pre-op evaluation and timeout performed Spinal Block Patient position: sitting Prep: DuraPrep Patient monitoring: heart rate, cardiac monitor, continuous pulse ox and blood pressure Approach: midline Location: L3-4 Injection technique: single-shot Needle Needle type: Sprotte  Needle gauge: 24 G Needle length: 9 cm Assessment Sensory level: T4 Events: CSF return

## 2022-06-01 NOTE — Transfer of Care (Signed)
Immediate Anesthesia Transfer of Care Note  Patient: Emily [name]  Procedure(s) Performed: POST PARTUM TUBAL LIGATION  Patient Location: PACU  Anesthesia Type:Spinal  Level of Consciousness: awake, alert  and oriented  Airway & Oxygen Therapy: Patient Spontanous Breathing  Post-op Assessment: Report given to RN and Post -op Vital signs reviewed and stable  Post vital signs: Reviewed and stable  Last Vitals:  Vitals Value Taken Time  BP    Temp    Pulse    Resp    SpO2      Last Pain:  Vitals:   06/01/22 0813  TempSrc:   PainSc: 3          Complications: No notable events documented.

## 2022-06-01 NOTE — Lactation Note (Signed)
This note was copied from a baby's chart. Lactation Consultation Note  Patient Name: Emily [name] VOPFY'T Date: 06/01/2022 Reason for consult: Follow-up assessment;Early term 37-38.6wks;Infant weight loss;Other (Comment) (8 % weight loss,) Age:35 hours Per dad mom is in the OR having her tubes tied.  LC reviewed pace feeding.  LC will f/u with mom when she returns.   Maternal Data    Feeding Mother's Current Feeding Choice: Breast Milk and Formula Nipple Type: Nfant Standard Flow (white)  LATCH Score                    Lactation Tools Discussed/Used    Interventions Interventions: Education  Discharge Pump: DEBP;Personal  Consult Status Consult Status: Follow-up Date: 06/01/22 Follow-up type: In-patient    Matilde Sprang Donnarae Rae 06/01/2022, 11:08 AM

## 2022-06-01 NOTE — Progress Notes (Signed)
Bilateral tubal ligation reviewed with R&B including but not limited to bleeding, infection, injury to other organs, irreversibility and failure rate of 12/998. Questions answered, pt desires to proceed.  Myna Hidalgo, DO Attending Obstetrician & Gynecologist, [facility] for [employer], [facility]

## 2022-06-01 NOTE — Progress Notes (Signed)
Post Partum Day #2 Subjective: no complaints, up ad lib, and tolerating PO; has been NPO since MN- still desires ppBTL; breast and bottlefeeding; circumcision already consented for  Objective: Blood pressure 122/78, pulse 66, temperature 98.2 F (36.8 C), temperature source Oral, resp. rate 18, height 4\' 11"  (1.499 m), weight 74.3 kg, last menstrual period 09/01/2021, SpO2 100 %, unknown if currently breastfeeding.  Physical Exam:  General: alert, cooperative, and no distress Lochia: appropriate Uterine Fundus: firm DVT Evaluation: No evidence of DVT seen on physical exam.  Recent Labs    05/30/22 0034  HGB 10.9*  HCT 32.2*   Fasting CBG: 99  Assessment/Plan: For ppBTL later this morning; would like to be discharged home later today if all is well Infant to be circumcised when peds is in agreement   LOS: 2 days   06/01/22 CNM 06/01/2022, 8:39 AM

## 2022-06-01 NOTE — Lactation Note (Signed)
This note was copied from a baby's chart. Lactation Consultation Note  Patient Name: Boy Murdis Flitton ULAGT'X Date: 06/01/2022 Reason for consult: Follow-up assessment;Other (Comment) (per [facility] mom has returned from her Tubal and she is in alot of pain. LC stopped to let her know LC would check on  her later.) Age:35 hours  Maternal Data    Feeding Mother's Current Feeding Choice: Breast Milk and Formula  LATCH Score                    Lactation Tools Discussed/Used    Interventions    Discharge    Consult Status Consult Status: Follow-up Date: 06/01/22 Follow-up type: In-patient    Matilde Sprang Zhane Bluitt 06/01/2022, 3:11 PM

## 2022-06-01 NOTE — Anesthesia Postprocedure Evaluation (Signed)
Anesthesia Post Note  Patient: Emily [name]  Procedure(s) Performed: AN AD HOC LABOR EPIDURAL     Patient location during evaluation: Mother Baby Anesthesia Type: Epidural Level of consciousness: awake Pain management: satisfactory to patient Vital Signs Assessment: post-procedure vital signs reviewed and stable Respiratory status: spontaneous breathing Cardiovascular status: stable Anesthetic complications: no   No notable events documented.  Last Vitals:  Vitals:   05/31/22 2137 06/01/22 0540  BP: 113/78 122/78  Pulse: 80 66  Resp: 17 18  Temp: 36.6 C 36.8 C  SpO2: 100% 100%    Last Pain:  Vitals:   06/01/22 0540  TempSrc: Oral  PainSc: 5    Pain Goal:                   Cephus Shelling

## 2022-06-01 NOTE — Anesthesia Postprocedure Evaluation (Signed)
Anesthesia Post Note  Patient: Emily [name]  Procedure(s) Performed: POST PARTUM TUBAL LIGATION     Patient location during evaluation: PACU Anesthesia Type: Spinal Level of consciousness: oriented and awake and alert Pain management: pain level controlled Vital Signs Assessment: post-procedure vital signs reviewed and stable Respiratory status: spontaneous breathing, respiratory function stable and patient connected to nasal cannula oxygen Cardiovascular status: blood pressure returned to baseline and stable Postop Assessment: no headache, no backache and no apparent nausea or vomiting Anesthetic complications: no   No notable events documented.  Last Vitals:  Vitals:   06/01/22 1415 06/01/22 1445  BP: 129/82 (!) 147/86  Pulse: 78 76  Resp: 19 16  Temp:  36.5 C  SpO2: 100% 100%    Last Pain:  Vitals:   06/01/22 1445  TempSrc: Oral  PainSc: 10-Worst pain ever   Pain Goal: Patients Stated Pain Goal: 5 (06/01/22 1415)                 Damond Borchers

## 2022-06-02 ENCOUNTER — Encounter (HOSPITAL_COMMUNITY): Payer: Self-pay | Admitting: Obstetrics & Gynecology

## 2022-06-05 LAB — SURGICAL PATHOLOGY

## 2022-06-07 ENCOUNTER — Inpatient Hospital Stay (HOSPITAL_COMMUNITY): Admission: AD | Admit: 2022-06-07 | Payer: Medicaid Other | Source: Home / Self Care | Admitting: Family Medicine

## 2022-06-07 ENCOUNTER — Encounter (HOSPITAL_COMMUNITY): Payer: Self-pay | Admitting: Obstetrics and Gynecology

## 2022-06-07 ENCOUNTER — Telehealth (HOSPITAL_COMMUNITY): Payer: Self-pay | Admitting: *Deleted

## 2022-06-07 ENCOUNTER — Inpatient Hospital Stay (HOSPITAL_COMMUNITY)
Admission: AD | Admit: 2022-06-07 | Discharge: 2022-06-07 | Disposition: A | Discharge status: Home / Self Care | Payer: Medicaid Other | Attending: Obstetrics and Gynecology | Admitting: Obstetrics and Gynecology

## 2022-06-07 ENCOUNTER — Encounter: Payer: Self-pay | Admitting: Certified Nurse Midwife

## 2022-06-07 ENCOUNTER — Inpatient Hospital Stay (HOSPITAL_COMMUNITY): Payer: Medicaid Other

## 2022-06-07 DIAGNOSIS — Z79899 Other long term (current) drug therapy: Secondary | ICD-10-CM | POA: Diagnosis not present

## 2022-06-07 DIAGNOSIS — G43909 Migraine, unspecified, not intractable, without status migrainosus: Secondary | ICD-10-CM | POA: Insufficient documentation

## 2022-06-07 DIAGNOSIS — O165 Unspecified maternal hypertension, complicating the puerperium: Secondary | ICD-10-CM | POA: Insufficient documentation

## 2022-06-07 LAB — CBC WITH DIFFERENTIAL/PLATELET
Abs Immature Granulocytes: 0.16 10*3/uL — ABNORMAL HIGH (ref 0.00–0.07)
Basophils Absolute: 0 10*3/uL (ref 0.0–0.1)
Basophils Relative: 0 %
Eosinophils Absolute: 0.3 10*3/uL (ref 0.0–0.5)
Eosinophils Relative: 2 %
HCT: 40.3 % (ref 36.0–46.0)
Hemoglobin: 13.3 g/dL (ref 12.0–15.0)
Immature Granulocytes: 1 %
Lymphocytes Relative: 15 %
Lymphs Abs: 2 10*3/uL (ref 0.7–4.0)
MCH: 28.9 pg (ref 26.0–34.0)
MCHC: 33 g/dL (ref 30.0–36.0)
MCV: 87.6 fL (ref 80.0–100.0)
Monocytes Absolute: 0.6 10*3/uL (ref 0.1–1.0)
Monocytes Relative: 4 %
Neutro Abs: 10.6 10*3/uL — ABNORMAL HIGH (ref 1.7–7.7)
Neutrophils Relative %: 78 %
Platelets: 317 10*3/uL (ref 150–400)
RBC: 4.6 MIL/uL (ref 3.87–5.11)
RDW: 13.2 % (ref 11.5–15.5)
WBC: 13.7 10*3/uL — ABNORMAL HIGH (ref 4.0–10.5)
nRBC: 0 % (ref 0.0–0.2)

## 2022-06-07 LAB — COMPREHENSIVE METABOLIC PANEL
ALT: 25 U/L (ref 0–44)
AST: 19 U/L (ref 15–41)
Albumin: 3.8 g/dL (ref 3.5–5.0)
Alkaline Phosphatase: 89 U/L (ref 38–126)
Anion gap: 13 (ref 5–15)
BUN: 23 mg/dL — ABNORMAL HIGH (ref 6–20)
CO2: 20 mmol/L — ABNORMAL LOW (ref 22–32)
Calcium: 9.5 mg/dL (ref 8.9–10.3)
Chloride: 108 mmol/L (ref 98–111)
Creatinine, Ser: 0.81 mg/dL (ref 0.44–1.00)
GFR, Estimated: >60 mL/min (ref >60)
Glucose, Bld: 82 mg/dL (ref 70–99)
Potassium: 3.9 mmol/L (ref 3.5–5.1)
Sodium: 141 mmol/L (ref 135–145)
Total Bilirubin: 0.5 mg/dL (ref 0.3–1.2)
Total Protein: 7.2 g/dL (ref 6.5–8.1)

## 2022-06-07 LAB — URINALYSIS, ROUTINE W REFLEX MICROSCOPIC
Bilirubin Urine: NEGATIVE
Glucose, UA: NEGATIVE mg/dL
Ketones, ur: 5 mg/dL — AB
Leukocytes,Ua: NEGATIVE
Nitrite: NEGATIVE
Protein, ur: NEGATIVE mg/dL
Specific Gravity, Urine: 1.029 (ref 1.005–1.030)
pH: 5 (ref 5.0–8.0)

## 2022-06-07 LAB — PROTEIN / CREATININE RATIO, URINE
Creatinine, Urine: 177.67 mg/dL
Protein Creatinine Ratio: 0.06 mg/mg{Cre} (ref 0.00–0.15)
Total Protein, Urine: 11 mg/dL

## 2022-06-07 MED ORDER — NIFEDIPINE ER OSMOTIC RELEASE 30 MG PO TB24: 30.0000 mg | ORAL_TABLET | Freq: Every day | ORAL | 11 refills | Status: DC

## 2022-06-07 MED ORDER — LABETALOL HCL 5 MG/ML IV SOLN
INTRAVENOUS | Status: AC
  Administered 2022-06-07: 20 mg via INTRAVENOUS
  Filled 2022-06-07: qty 4

## 2022-06-07 MED ORDER — HYDRALAZINE HCL 20 MG/ML IJ SOLN: 10.0000 mg | INTRAMUSCULAR | Status: DC | PRN

## 2022-06-07 MED ORDER — LABETALOL HCL 5 MG/ML IV SOLN: 80.0000 mg | INTRAVENOUS | Status: DC | PRN

## 2022-06-07 MED ORDER — LABETALOL HCL 5 MG/ML IV SOLN: 40.0000 mg | INTRAVENOUS | Status: DC | PRN

## 2022-06-07 MED ORDER — BUTALBITAL-APAP-CAFFEINE 50-325-40 MG PO TABS: 1.0000 | ORAL_TABLET | Freq: Four times a day (QID) | ORAL | 0 refills | Status: DC | PRN

## 2022-06-07 MED ORDER — LABETALOL HCL 5 MG/ML IV SOLN: 20.0000 mg | INTRAVENOUS | Status: DC | PRN

## 2022-06-07 NOTE — MAU Note (Signed)
.  Emily [name] is a 36 y.o. postpartum vaginal delivery here in MAU reporting: BP "189/120" at home. States she has had migraines every day since she left the hospital but has not had one today. Denies other PIH s/sx. No other concerns or pain.   Pain score: 0

## 2022-06-07 NOTE — Telephone Encounter (Signed)
Hospital Discharge Follow-Up Call:  Patient reports that she is doing OK.  She has been having "migraines" and this is a new issue for her.  She normally doesn't have headaches.  She denies blurred vision, seeing spots, or RUQ pain.  She reports having a BP cuff at home but she has not taken her BP.  Encouraged her to check her BP and notifiy MD/CNM if SBP >130 or DBP >80.  She said that she has an appointment with provider tomorrow.  Advised her to keep her appointment tomorrow and that MAU is available if she feels she needs to be seen this evening or overnight.  Patient declined EPDS today.  Patient says that baby is well and she has no concerns about baby's health.  She reports that baby sleeps in a bassinet.

## 2022-06-07 NOTE — MAU Provider Note (Signed)
History     CSN: [id number]  Arrival date and time: 06/07/22 1746   Event Date/Time   First Provider Initiated Contact with Patient 06/07/22 1812      Chief Complaint  Patient presents with   Hypertension   Emily [name], a  36 y.o. [phone] at 1 week postpartum presents to MAU with Elevated BPs at home and new onset of migraines for 1 week. Patient states that she has has migraine headache "everyday since she was discharged, expect for today." She states today is the first day without one. Previously attempted PO Ibuprofen for HA without relief. Believed headaches were being caused by stressors of newborn at home. Patient endorses "black spots & floaters with headache" but denies epigastric pain and swelling. Patient states she had not been checking her BP regularly since delivery, but received a call today for elevated BPs from [facility] and decided to check at home. Reports BP of 189/190 at home. Repeat check was "170/120."          OB History     Gravida  3   Para  3   Term  3   Preterm      AB      Living  3      SAB      IAB      Ectopic      Multiple  0   Live Births  3           Past Medical History:  Diagnosis Date   Gestational diabetes     Past Surgical History:  Procedure Laterality Date   BREAST REDUCTION SURGERY  08/04/2020   CHOLECYSTECTOMY     TUBAL LIGATION N/A 06/01/2022   Procedure: POST PARTUM TUBAL LIGATION;  Surgeon: Janyth Pupa, DO;  Location: [place] [facility];  Service: Gynecology;  Laterality: N/A;    Family History  Problem Relation Age of Onset   Hypertension Mother    Diabetes Mother    Stroke Father    Kidney disease Father    Hypertension Father    Diabetes Father    Cancer Maternal Grandmother    Diabetes Maternal Grandmother    Diabetes Paternal Grandmother    Heart disease Neg Hx     Social History   Tobacco Use   Smoking status: Never   Smokeless tobacco: Never  Vaping Use   Vaping Use: Never used  Substance  Use Topics   Alcohol use: Not Currently   Drug use: Never    Allergies: No Known Allergies  Medications Prior to Admission  Medication Sig Dispense Refill Last Dose   ibuprofen (ADVIL) 600 MG tablet Take 1 tablet (600 mg total) by mouth every 6 (six) hours as needed. 30 tablet 0 06/07/2022 at 1500   Prenatal Vit-Fe Fumarate-FA (PREPLUS) 27-1 MG TABS Take 1 tablet by mouth daily. 30 tablet 6 06/06/2022   Accu-Chek Softclix Lancets lancets Use as instructed 100 each 12    acetaminophen (TYLENOL) 325 MG tablet Take 2 tablets (650 mg total) by mouth every 4 (four) hours as needed (for pain scale < 4).      Blood Glucose Monitoring Suppl (ACCU-CHEK GUIDE) w/Device KIT 1 Device by Does not apply route as needed. 1 kit 0    Blood Pressure Monitoring (BLOOD PRESSURE KIT) DEVI 1 Device by Does not apply route once a week. 1 each 0    gabapentin (NEURONTIN) 300 MG capsule Take 1 capsule (300 mg total) by mouth 3 (three) times daily  for 3 days. 9 capsule 0    glucose blood (ACCU-CHEK GUIDE) test strip Use as instructed 100 each 12    ondansetron (ZOFRAN) 4 MG tablet Take 1 tablet (4 mg total) by mouth every 8 (eight) hours as needed for nausea or vomiting. 20 tablet 0    ondansetron (ZOFRAN-ODT) 4 MG disintegrating tablet Take 1 tablet (4 mg total) by mouth every 8 (eight) hours as needed for nausea or vomiting. 15 tablet 0    zolpidem (AMBIEN) 5 MG tablet Take 1 tablet (5 mg total) by mouth at bedtime as needed for sleep. 15 tablet 0     Review of Systems  Constitutional:  Negative for activity change, chills, fatigue and fever.  Eyes:  Positive for photophobia and visual disturbance.  Cardiovascular:  Negative for leg swelling.  Gastrointestinal:  Negative for abdominal pain.  Neurological:  Positive for headaches. Negative for dizziness, seizures, weakness and light-headedness.   Physical Exam   Blood pressure (!) 162/88, pulse 81, temperature 98.4 F (36.9 C), temperature source Oral, resp.  rate 14, SpO2 99 %, unknown if currently breastfeeding.  Physical Exam Vitals and nursing note reviewed.  Constitutional:      General: She is not in acute distress.    Appearance: Normal appearance.  HENT:     Head: Normocephalic.  Pulmonary:     Effort: Pulmonary effort is normal.  Skin:    General: Skin is warm and dry.  Neurological:     General: No focal deficit present.     Mental Status: She is alert and oriented to person, place, and time.     Deep Tendon Reflexes: Reflexes normal.  Psychiatric:        Mood and Affect: Mood normal.     MAU Course  Procedures Lab Orders         Comprehensive metabolic panel         CBC with Differential/Platelet         Protein / creatinine ratio, urine    Orders Placed This Encounter  Procedures   Comprehensive metabolic panel    Standing Status:   Standing    Number of Occurrences:   1   CBC with Differential/Platelet    Standing Status:   Standing    Number of Occurrences:   1   Protein / creatinine ratio, urine    Standing Status:   Standing    Number of Occurrences:   1   Vital signs    Standing Status:   Standing    Number of Occurrences:   1   Notify physician (specify) Confirmatory reading of BP> 160/110 15 minutes later    Confirmatory reading of BP> 160/110 15 minutes later    Standing Status:   Standing    Number of Occurrences:   1    Order Specific Question:   Notify Physician    Answer:   Temp greater than or equal to 100.4    Order Specific Question:   Notify Physician    Answer:   RR greater than 24 or less than 10    Order Specific Question:   Notify Physician    Answer:   HR greater than 120 or less than 50    Order Specific Question:   Notify Physician    Answer:   SBP greater than 160 mmHG or less than 80 mmHG    Order Specific Question:   Notify Physician    Answer:   DBP greater than 110 mmHG  or less than 45 mmHG    Order Specific Question:   Notify Physician    Answer:   Urinary output is less  than 139m for any 4 hour period   Fetal monitoring    Standing Status:   Standing    Number of Occurrences:   1   Continuous tocometry    Standing Status:   Standing    Number of Occurrences:   1   Strict intake and output    Standing Status:   Standing    Number of Occurrences:   1   Measure blood pressure    20 minutes after giving hydralazine 10 MG IV dose.  Call MD if SBP >/= 160 or DBP >/= 110.    Standing Status:   Standing    Number of Occurrences:   1   Results for orders placed or performed during the hospital encounter of 06/07/22 (from the past 24 hour(s))  Comprehensive metabolic panel     Status: Abnormal   Collection Time: 06/07/22  6:25 PM  Result Value Ref Range   Sodium 141 135 - 145 mmol/L   Potassium 3.9 3.5 - 5.1 mmol/L   Chloride 108 98 - 111 mmol/L   CO2 20 (L) 22 - 32 mmol/L   Glucose, Bld 82 70 - 99 mg/dL   BUN 23 (H) 6 - 20 mg/dL   Creatinine, Ser 0.81 0.44 - 1.00 mg/dL   Calcium 9.5 8.9 - 10.3 mg/dL   Total Protein 7.2 6.5 - 8.1 g/dL   Albumin 3.8 3.5 - 5.0 g/dL   AST 19 15 - 41 U/L   ALT 25 0 - 44 U/L   Alkaline Phosphatase 89 38 - 126 U/L   Total Bilirubin 0.5 0.3 - 1.2 mg/dL   GFR, Estimated >60 >60 mL/min   Anion gap 13 5 - 15  CBC with Differential/Platelet     Status: Abnormal   Collection Time: 06/07/22  6:25 PM  Result Value Ref Range   WBC 13.7 (H) 4.0 - 10.5 K/uL   RBC 4.60 3.87 - 5.11 MIL/uL   Hemoglobin 13.3 12.0 - 15.0 g/dL   HCT 40.3 36.0 - 46.0 %   MCV 87.6 80.0 - 100.0 fL   MCH 28.9 26.0 - 34.0 pg   MCHC 33.0 30.0 - 36.0 g/dL   RDW 13.2 11.5 - 15.5 %   Platelets 317 150 - 400 K/uL   nRBC 0.0 0.0 - 0.2 %   Neutrophils Relative % 78 %   Neutro Abs 10.6 (H) 1.7 - 7.7 K/uL   Lymphocytes Relative 15 %   Lymphs Abs 2.0 0.7 - 4.0 K/uL   Monocytes Relative 4 %   Monocytes Absolute 0.6 0.1 - 1.0 K/uL   Eosinophils Relative 2 %   Eosinophils Absolute 0.3 0.0 - 0.5 K/uL   Basophils Relative 0 %   Basophils Absolute 0.0 0.0 - 0.1  K/uL   Immature Granulocytes 1 %   Abs Immature Granulocytes 0.16 (H) 0.00 - 0.07 K/uL  Protein / creatinine ratio, urine     Status: None   Collection Time: 06/07/22  6:26 PM  Result Value Ref Range   Creatinine, Urine 177.67 mg/dL   Total Protein, Urine 11 mg/dL   Protein Creatinine Ratio 0.06 0.00 - 0.15 mg/mg[Cre]  Urinalysis, Routine w reflex microscopic     Status: Abnormal   Collection Time: 06/07/22  6:26 PM  Result Value Ref Range   Color, Urine YELLOW YELLOW   APPearance  HAZY (A) CLEAR   Specific Gravity, Urine 1.029 1.005 - 1.030   pH 5.0 5.0 - 8.0   Glucose, UA NEGATIVE NEGATIVE mg/dL   Hgb urine dipstick MODERATE (A) NEGATIVE   Bilirubin Urine NEGATIVE NEGATIVE   Ketones, ur 5 (A) NEGATIVE mg/dL   Protein, ur NEGATIVE NEGATIVE mg/dL   Nitrite NEGATIVE NEGATIVE   Leukocytes,Ua NEGATIVE NEGATIVE   RBC / HPF 0-5 0 - 5 RBC/hpf   WBC, UA 0-5 0 - 5 WBC/hpf   Bacteria, UA RARE (A) NONE SEEN   Squamous Epithelial / LPF 0-5 0 - 5   Mucus PRESENT    Amorphous Crystal PRESENT    Severe range BP x2 Patient treated with 48m Labetalol IV.   Patient Vitals for the past 24 hrs:  BP Temp Temp src Pulse Resp SpO2  06/07/22 1920 (!) 145/85 -- -- 77 -- 100 %  06/07/22 1910 140/76 -- -- 77 -- 98 %  06/07/22 1900 (!) 149/92 -- -- 86 -- 99 %  06/07/22 1850 (!) 146/80 -- -- 66 -- 99 %  06/07/22 1840 (!) 148/85 -- -- 73 -- 99 %  06/07/22 1830 (!) 168/92 -- -- 86 -- 100 %  06/07/22 1809 (!) 162/88 98.4 F (36.9 C) Oral 81 14 99 %    MDM Consulted Dr. ARoselie Awkward based on new onset of severe range BPs with multiple days of headaches and Labetalol treatment in MAU. The Recommendation to initate PO 373m Procardia XL and follow up in the office on this Friday for a BP check provided.  Pre Eclampsia labs normal.  Assessment and Plan   1. Postpartum care and examination   2. Postpartum hypertension   - New onset of Postpartum hypertension discussed with patient and FOB.  - Strict PreE  return precautions discussed at bedside.   - PO 30 Procardia XL sent to Outpatient pharmacy. - Message sent to [facility] to have BP and incision checked.  - Patient discharged home in stable condition.  - Patient may return to MAU as needed.   ShJacquiline DoeMSN, CNM  06/07/2022, 6:12 PM

## 2022-06-07 NOTE — MAU Note (Signed)
Patient signed E-signature but signature malfunctioned. Patient verbalized understanding of AVS.

## 2022-06-08 ENCOUNTER — Ambulatory Visit: Payer: Medicaid Other

## 2022-06-08 ENCOUNTER — Telehealth: Payer: Self-pay

## 2022-06-08 NOTE — Telephone Encounter (Signed)
-----   Message from Carlynn Herald, [place] sent at 06/07/2022  7:49 PM EDT ----- Regarding: BP Follow up This patient was seen in MAU today. Started on Procardia for High BP. She has a visit scheduled for 6/22. Can we move this appointment to this Friday 6/23 please? Anything after 8am please.   - Thank you!  - Psychologist, educational

## 2022-06-08 NOTE — Telephone Encounter (Signed)
Call placed to pt. Spoke with pt. Pt aware of need of new BP and incision check. Pt agreeable to new appt on 6/23 at 1030am for nurse visit. Pt verbalized understanding.  Laney Pastor

## 2022-06-09 ENCOUNTER — Ambulatory Visit: Payer: Medicaid Other

## 2022-06-12 ENCOUNTER — Ambulatory Visit (INDEPENDENT_AMBULATORY_CARE_PROVIDER_SITE_OTHER): Payer: Medicaid Other

## 2022-06-12 ENCOUNTER — Other Ambulatory Visit: Payer: Self-pay

## 2022-06-12 VITALS — BP 134/102 | HR 85 | Wt 142.5 lb

## 2022-06-12 DIAGNOSIS — Z0130 Encounter for examination of blood pressure without abnormal findings: Secondary | ICD-10-CM

## 2022-06-12 DIAGNOSIS — O139 Gestational [pregnancy-induced] hypertension without significant proteinuria, unspecified trimester: Secondary | ICD-10-CM

## 2022-06-12 DIAGNOSIS — Z5189 Encounter for other specified aftercare: Secondary | ICD-10-CM

## 2022-06-12 MED ORDER — LABETALOL HCL 200 MG PO TABS: 200.0000 mg | ORAL_TABLET | Freq: Two times a day (BID) | ORAL | 3 refills | Status: DC

## 2022-06-12 NOTE — Progress Notes (Signed)
Blood Pressure/Incision Check Visit  Emily [name] is here for blood pressure and incision check following vaginal delivery on 05/30/22 with PP BTL on 06/01/22. Single incision at umbilicus is open to air. Incision appears clean, dry, and intact. Encouraged pt to continue good wound care.   Patient had multiple elevated BP while admitted for delivery. Discharged on nifedipine 30 mg daily; reports taking last less than 1 hour ago. BP today is 134/102. Reports intermittent headache since discharge. Unable to recall if this is improved by Tylenol or ibuprofen. Reviewed with Vergie Living, MD who recommends d/c of nifedipine and begin Labetalol 200 mg BID and return in 1 week for BP check. Reviewed provider recommendation with patient. Return precautions reviewed with patient; instructed pt to check BP at home with any occurrence of headache.  Fleet Contras RN 06/12/22

## 2022-06-19 ENCOUNTER — Ambulatory Visit: Payer: Medicaid Other

## 2022-06-28 ENCOUNTER — Other Ambulatory Visit: Payer: Self-pay

## 2022-06-28 DIAGNOSIS — O24415 Gestational diabetes mellitus in pregnancy, controlled by oral hypoglycemic drugs: Secondary | ICD-10-CM

## 2022-07-06 ENCOUNTER — Telehealth: Payer: Self-pay

## 2022-07-06 NOTE — Telephone Encounter (Signed)
Patient called office. Spoke with pt. Pt states having possible period return since being PP from 05/30/2022 delivery. Pt states having abd cramps with heavier than normal bleeding that started yesterday. Pt denies any soaking pads or clots at this time. Pt advised this is normal especially first cycle PP. Pt also advised to take OTC Ibuprofen 800mg  q 6hrs as needed for cramps. Pt grateful for call advice. Pt also had PP BTL and is formula feeding only. Pt verbalized understanding. Pt advised if bleeding is worsening and soaking pads, then go to ED. Pt agreeable to plan of care. Has PP appt on 07/10/22  [facility]

## 2022-07-10 ENCOUNTER — Ambulatory Visit: Payer: Medicaid Other | Admitting: Medical

## 2022-07-10 ENCOUNTER — Other Ambulatory Visit: Payer: Self-pay

## 2022-07-18 ENCOUNTER — Encounter: Payer: Self-pay | Admitting: Advanced Practice Midwife

## 2022-07-18 ENCOUNTER — Other Ambulatory Visit: Payer: Medicaid Other

## 2022-07-18 ENCOUNTER — Ambulatory Visit (INDEPENDENT_AMBULATORY_CARE_PROVIDER_SITE_OTHER): Payer: Medicaid Other | Admitting: Advanced Practice Midwife

## 2022-07-18 VITALS — BP 138/95 | HR 63 | Wt 149.0 lb

## 2022-07-18 DIAGNOSIS — O24415 Gestational diabetes mellitus in pregnancy, controlled by oral hypoglycemic drugs: Secondary | ICD-10-CM

## 2022-07-18 DIAGNOSIS — Z8632 Personal history of gestational diabetes: Secondary | ICD-10-CM

## 2022-07-18 DIAGNOSIS — O165 Unspecified maternal hypertension, complicating the puerperium: Secondary | ICD-10-CM | POA: Diagnosis not present

## 2022-07-18 MED ORDER — LISINOPRIL 10 MG PO TABS: 10.0000 mg | ORAL_TABLET | Freq: Every day | ORAL | 3 refills | Status: DC

## 2022-07-18 NOTE — Progress Notes (Signed)
Amb    Post Partum Visit Note  Emily [name] is a 36 y.o. G7P3003 female who presents for a postpartum visit. She is 7 weeks postpartum following a normal spontaneous vaginal delivery.  I have fully reviewed the prenatal and intrapartum course. The delivery was at 37.6 gestational weeks.  Anesthesia: epidural. Postpartum course has been complicated by hypertension. Was started on Procardia. Was having HA so was switched to Labetalol 200 BID. Still having mild HA"s and seeing spots. Baby is doing well. Baby is feeding by bottle - Similac . Bleeding no bleeding. Bowel function is normal. Bladder function is normal. Patient is sexually active. Contraception method is tubal ligation. Postpartum depression screening: negative.   The pregnancy intention screening data noted above was reviewed. Had BTL.  Certain that she had Pap smear in November 2022 in [place], [place]. Does not know results. Hx some type of procedure for abnormal Pap in the past (some type of excision). Records request but not received.    Health Maintenance Due  Topic Date Due   URINE MICROALBUMIN  Never done   PAP SMEAR-Modifier  Never done   INFLUENZA VACCINE  07/18/2022    The following portions of the patient's history were reviewed and updated as appropriate: allergies, current medications, past family history, past medical history, past social history, past surgical history, and problem list.  Review of Systems Pertinent items are noted in HPI.  Objective:  BP (!) 138/95   Pulse 63   Wt 149 lb (67.6 kg)   Breastfeeding No   BMI 30.09 kg/m    General:  alert, cooperative, appears stated age, and no distress   Breasts:  Declined  Lungs: Normal rate and effort  Heart:  Normal rate  Abdomen: soft, non-tender; bowel sounds normal; no masses,  no organomegaly   Wound NA  GU exam:  not indicated       Assessment:  1. Postpartum hypertension--BP still elevated here and at home on Labetalol.  - Discussed increased  risk of CHTN w/ Hx GHTN. Wants to switch to different BP med if she is going to have to stay on one any longer. Prefers QD and family members done well on Lisinopril.  - Ambulatory referral to Family Practice - lisinopril (PRINIVIL) 10 MG tablet; Take 1 tablet (10 mg total) by mouth daily.  Dispense: 30 tablet; Refill: 3 Virtual BP check in 1 week.   2. Postpartum care and examination - Nml exam except for HTN  3. History of gestational diabetes - Needs GTT   Plan:   Essential components of care per ACOG recommendations:  1.  Mood and well being: Patient with negative depression screening today. Reviewed local resources for support.  - Patient tobacco use? No.   - hx of drug use? No.    2. Infant care and feeding:  -Patient currently breastmilk feeding? No.  -Social determinants of health (SDOH) reviewed in EPIC. No concerns.  3. Sexuality, contraception and birth spacing - Patient does not want a pregnancy in the next year.  Desired family size is 3 children.  - Reviewed reproductive life planning. Reviewed contraceptive methods based on pt preferences and effectiveness.  Patient desired Female Sterilization today.   - Discussed birth spacing of 18 months  4. Sleep and fatigue -Encouraged family/partner/community support of 4 hrs of uninterrupted sleep to help with mood and fatigue  5. Physical Recovery  - Discussed patients delivery and complications. She describes her labor as good. - Patient had a Vaginal,  no problems at delivery. Patient had a 1st degree laceration. Perineal healing reviewed. Patient expressed understanding - Patient has urinary incontinence? No. - Patient is safe to resume physical and sexual activity  6.  Health Maintenance - HM due items addressed Yes - Last pap smear 2022. Not in Epic. Hx of some type of excision per ot. Records requested. Without knowing previous Pap but that an exceision fo some sort was performed, pt likely needs F/U one year after  last Pap. Pap smear not done at today's visit. Requested to scheduled it next month.  -Breast Cancer screening indicated? No.   7. Chronic Disease/Pregnancy Condition follow up: Hypertension and Gestational Diabetes  - PCP follow up  [place], CNM 07/18/2022 10:19 PM

## 2022-07-19 LAB — GLUCOSE TOLERANCE, 2 HOURS
Glucose, 2 hour: 84 mg/dL (ref 70–139)
Glucose, GTT - Fasting: 86 mg/dL (ref 70–99)

## 2022-07-25 ENCOUNTER — Telehealth: Payer: Medicaid Other | Admitting: General Practice

## 2022-07-25 NOTE — Progress Notes (Signed)
Called patient for virtual blood pressure check appt, no answer- left message to call us back regarding your blood pressure.  Chase Caller RN BSN 07/25/22

## 2022-07-26 ENCOUNTER — Telehealth: Payer: Self-pay | Admitting: General Practice

## 2022-07-26 ENCOUNTER — Other Ambulatory Visit: Payer: Self-pay | Admitting: Advanced Practice Midwife

## 2022-07-26 DIAGNOSIS — O165 Unspecified maternal hypertension, complicating the puerperium: Secondary | ICD-10-CM

## 2022-07-26 NOTE — Telephone Encounter (Signed)
Patient called into front office stating she recently lost her lisinopril prescription and wants to know if we can send another to the pharmacy. Told patient the pharmacy should give her more if she calls and explains but insurance may not pay for it. Suggested if that happened, she could try using a savings card. Patient verbalized understanding.

## 2022-08-14 ENCOUNTER — Encounter: Payer: Medicaid Other | Admitting: Medical

## 2022-08-14 NOTE — Progress Notes (Signed)
Called patient at 9:22 to ask her to sign into Mychart for Virtual BP visit. She did not answer. LM that I was on Mychart waiting for her to sign in.   Called patient at 9:32 and she answered. Asked her to sign into Mychart and join virtual visit. She agreed to do so. Patient did not sign into Mychart.   Called her back at 9:37 and she did not answer. LM that she needs to call the office at [phone] to reschedule her visit for one day this week.

## 2022-08-18 ENCOUNTER — Other Ambulatory Visit (HOSPITAL_COMMUNITY)
Admission: RE | Admit: 2022-08-18 | Discharge: 2022-08-18 | Disposition: A | Discharge status: Home / Self Care | Payer: Medicaid Other | Source: Ambulatory Visit | Attending: Advanced Practice Midwife | Admitting: Advanced Practice Midwife

## 2022-08-18 ENCOUNTER — Encounter: Payer: Self-pay | Admitting: Advanced Practice Midwife

## 2022-08-18 ENCOUNTER — Other Ambulatory Visit: Payer: Self-pay

## 2022-08-18 ENCOUNTER — Ambulatory Visit (INDEPENDENT_AMBULATORY_CARE_PROVIDER_SITE_OTHER): Payer: Medicaid Other | Admitting: Advanced Practice Midwife

## 2022-08-18 VITALS — BP 136/91 | HR 87 | Wt 145.0 lb

## 2022-08-18 DIAGNOSIS — Z124 Encounter for screening for malignant neoplasm of cervix: Secondary | ICD-10-CM | POA: Insufficient documentation

## 2022-08-18 DIAGNOSIS — Z01419 Encounter for gynecological examination (general) (routine) without abnormal findings: Secondary | ICD-10-CM

## 2022-08-18 DIAGNOSIS — Z8759 Personal history of other complications of pregnancy, childbirth and the puerperium: Secondary | ICD-10-CM

## 2022-08-18 DIAGNOSIS — Z8632 Personal history of gestational diabetes: Secondary | ICD-10-CM

## 2022-08-18 DIAGNOSIS — Z8742 Personal history of other diseases of the female genital tract: Secondary | ICD-10-CM

## 2022-08-18 NOTE — Progress Notes (Unsigned)
Patient was offered Flu vaccine and STD screening and declined both  Due for pap smear. No questions or concerns.   Dawayne Patricia, CMA   08/18/22

## 2022-08-18 NOTE — Patient Instructions (Signed)
AREA FAMILY PRACTICE PHYSICIANS  [place] ([address]) [facility] [address] [place], [place] [address] [phone] Mon-Fri 8:30-12:30, 1:30-5:00 Accepting Medicaid [facility] [address], [place], [place] [address] [phone] Mon-Fri 8:00-5:30 [facility] [address] [place], [place] [address] [phone] Mon, Tue, Thur, Fri 8:30-5:00, Wed 10:00-7:00 (closed 1-2pm) Accepting Medicaid [facility] [address], [place], [place]  [address] Phone - [phone]   Fax - [phone]  [place] ([address]) [facility] [address] [place], [place] [address] [phone] Mon-Fri 8:00-5:00 [facility] ([facility])  [address] [place], [place] [address] [phone] Mon-Fri 8:30-5:30, Sat (Oct.-Mar.) 9:00-1:00 Accepting Medicaid  [place] ([address]) [facility] [address], [place], [place] [address] [phone] Mon-Fri 8:00-5:00  [place] ([address]) [facility] [address], [place], [place] [address] [phone] Mon-Fri 8:00-5:00 [facility] [address], [place], [place] [address] [phone] Mon-Fri 8:00-5:00 [facility] [address] [place], [place] [address] [phone] Mon-Fri 8:00-5:00 [facility] [address] [place] [address] [phone] Mon-Fri 7:30-5:30  [place] ([address] & [address]) [facility] [address] [place], [place] [address] [phone] Mon-Thur 8:00-6:00 Accepting Medicaid [facility] [address] [place], [place] [address] [phone] Mon-Thur 7:30-7:30, Fri 7:30-4:30 Accepting  Medicaid [facility] [address], [place], [place]  [address] [phone]   Fax - [phone]  [place] ([address] & [address]) [facility] [address] [place], [place] [address] [phone] Mon-Fri 7:00-5:00 [facility] [address], [place], [place] [address] [phone] Mon-Fri 8:00-5:00 Accepting Medicaid [facility] [address], , [place] [address] [phone] Mon-Fri 8:00-5:00 Accepting Medicaid  [place] ([address]) [facility] [place] [address] [place], [place] [address] [phone] Mon-Fri 8:00-5:00 [facility] [address], [place], [place] [address] [phone] Mon-Fri 8:00-5:00 Accepting Medicaid [facility] ([facility]) [address], [place], [place] [address] [phone] Mon-Fri 8:00-5:30, Sat&Sun by appointment (phones open at 8:30) Accepting Medicaid  [place] ([address]) [facility] [address] [place], [place] [address] [phone] Mon-Thur 8:00-7:00, Fri 8:00-5:00, Sat 8:00-12:00, Sun 9:00-12:00 Accepting Medicaid [facility] [address], [place], [place] [address] [phone] Mon-Thur 8:00-5:00 Accepting Medicaid [facility] [address] [place], [place] [address] [phone] Mon-Fri 8:00-5:30, Sat (Oct.-Mar.) 9:00-1:00 Accepting Medicaid  [place] ([address]) [facility] [address], [place], [place] [address] [phone] Mon-Fri 8:00-5:00 Accepting Medicaid   [place] ([address]) [facility] [place] [address], [place], [place] [address] [phone] Mon-Fri 8:00-5:00 [facility] [place] [address], [place],  [address] [phone] Mon-Fri 8:00-5:00 [facility] [place] [address] [place] [address], [place],  [address] [phone] Mon-Fri 8:00-5:00 After hours clinic ([address] [place],  [address]) [phone] Mon-Fri 5:00-8:00, Sat 12:00-6:00, Sun 10:00-4:00 Accepting Medicaid [facility] [place]   [address], [place], [place]  [address] [phone]   Fax - [phone]  [place] ([address]) [facility] [place] [address], [place], [place] [address] [phone] Mon-Fri 8:00-5:00 [facility] [place] ([facility] [place]) [address], [place],  [address] [phone] Mon-Thur 8:00-7:00, Fri 8:00-5:00, Sat 8:00-12:00    

## 2022-08-21 ENCOUNTER — Encounter: Payer: Self-pay | Admitting: Advanced Practice Midwife

## 2022-08-21 DIAGNOSIS — Z8759 Personal history of other complications of pregnancy, childbirth and the puerperium: Secondary | ICD-10-CM

## 2022-08-21 DIAGNOSIS — Z8742 Personal history of other diseases of the female genital tract: Secondary | ICD-10-CM

## 2022-08-21 HISTORY — DX: Personal history of other complications of pregnancy, childbirth and the puerperium: Z87.59

## 2022-08-21 HISTORY — DX: Personal history of other diseases of the female genital tract: Z87.42

## 2022-08-21 NOTE — Progress Notes (Signed)
GYNECOLOGY ANNUAL PREVENTATIVE CARE ENCOUNTER NOTE  History:     Emily [name] is a 36 y.o. G70P3003 female here for a routine annual gynecologic exam.  Current complaints: none.   Denies abnormal vaginal bleeding, discharge, pelvic pain, problems with intercourse or other gynecologic concerns.   Gx Gestation HTN. BP still elevated at PP visit. Switched BP meds due to side effects. On Lisinopril 10 mg PO QD.   Gynecologic History No LMP recorded. Contraception: tubal ligation Last Pap: Certain that she had Pap smear in November 2022 in [place], [place]. Does not know results. Hx some type of procedure for abnormal Pap in the past (some type of excision). Records request but not received. Last mammogram: NA. Not indicated.   Obstetric History OB History  Gravida Para Term Preterm AB Living  '3 3 3     3  ' SAB IAB Ectopic Multiple Live Births        0 3    # Outcome Date GA Lbr Len/2nd Weight Sex Delivery Anes PTL Lv  3 Term 05/30/22 [redacted]w[redacted]d/ 00:06 7 lb 0.5 oz (3.19 kg) M Vag-Spont EPI  LIV  2 Term 07/18/12 [id number] lb 5 oz (3.317 kg) M Vag-Spont EPI  LIV  1 Term 08/04/07 [id number] lb (3.175 kg) M Vag-Spont EPI  LIV    Past Medical History:  Diagnosis Date   Gestational diabetes    History of abnormal cervical Pap smear 08/21/2022   Hx some type of procedure for abnormal Pap in the past (some type of excision). Records request but not received.   History of gestational hypertension 08/21/2022    Past Surgical History:  Procedure Laterality Date   BREAST REDUCTION SURGERY  08/04/2020   CHOLECYSTECTOMY     TUBAL LIGATION N/A 06/01/2022   Procedure: POST PARTUM TUBAL LIGATION;  Surgeon: OzaJanyth PupaO;  Location: [facility] LD ORS;  Service: Gynecology;  Laterality: N/A;    Current Outpatient Medications on File Prior to Visit  Medication Sig Dispense Refill   lisinopril (PRINIVIL) 10 MG tablet Take 1 tablet (10 mg total) by mouth daily. 30 tablet 3   Prenatal Vit-Fe Fumarate-FA (PREPLUS)  27-1 MG TABS Take 1 tablet by mouth daily. 30 tablet 6   Accu-Chek Softclix Lancets lancets Use as instructed (Patient not taking: Reported on 06/12/2022) 100 each 12   acetaminophen (TYLENOL) 325 MG tablet Take 2 tablets (650 mg total) by mouth every 4 (four) hours as needed (for pain scale < 4). (Patient not taking: Reported on 07/18/2022)     Blood Glucose Monitoring Suppl (ACCU-CHEK GUIDE) w/Device KIT 1 Device by Does not apply route as needed. (Patient not taking: Reported on 06/12/2022) 1 kit 0   Blood Pressure Monitoring (BLOOD PRESSURE KIT) DEVI 1 Device by Does not apply route once a week. (Patient not taking: Reported on 07/18/2022) 1 each 0   butalbital-acetaminophen-caffeine (FIORICET) 50-325-40 MG tablet Take 1-2 tablets by mouth every 6 (six) hours as needed for headache. (Patient not taking: Reported on 07/18/2022) 20 tablet 0   gabapentin (NEURONTIN) 300 MG capsule Take 1 capsule (300 mg total) by mouth 3 (three) times daily for 3 days. (Patient not taking: Reported on 06/12/2022) 9 capsule 0   glucose blood (ACCU-CHEK GUIDE) test strip Use as instructed (Patient not taking: Reported on 06/12/2022) 100 each 12   ibuprofen (ADVIL) 600 MG tablet Take 1 tablet (600 mg total) by mouth every 6 (six) hours as needed. (Patient not taking: Reported  on 08/18/2022) 30 tablet 0   labetalol (NORMODYNE) 200 MG tablet Take 1 tablet (200 mg total) by mouth 2 (two) times daily. (Patient not taking: Reported on 08/18/2022) 60 tablet 3   NIFEdipine (PROCARDIA-XL/NIFEDICAL-XL) 30 MG 24 hr tablet Take 1 tablet (30 mg total) by mouth daily. (Patient not taking: Reported on 07/18/2022) 30 tablet 11   ondansetron (ZOFRAN-ODT) 4 MG disintegrating tablet Take 1 tablet (4 mg total) by mouth every 8 (eight) hours as needed for nausea or vomiting. (Patient not taking: Reported on 06/12/2022) 15 tablet 0   zolpidem (AMBIEN) 5 MG tablet Take 1 tablet (5 mg total) by mouth at bedtime as needed for sleep. (Patient not taking: Reported  on 06/12/2022) 15 tablet 0   No current facility-administered medications on file prior to visit.    No Known Allergies  Social History:  reports that she has never smoked. She has never used smokeless tobacco. She reports that she does not currently use alcohol. She reports that she does not use drugs.  Family History  Problem Relation Age of Onset   Hypertension Mother    Diabetes Mother    Stroke Father    Kidney disease Father    Hypertension Father    Diabetes Father    Cancer Maternal Grandmother    Diabetes Maternal Grandmother    Diabetes Paternal Grandmother    Heart disease Neg Hx     The following portions of the patient's history were reviewed and updated as appropriate: allergies, current medications, past family history, past medical history, past social history, past surgical history and problem list.  Review of Systems Review of Systems    Physical Exam:  BP (!) 136/91   Pulse 87   Wt 145 lb (65.8 kg)   Breastfeeding No   BMI 29.29 kg/m  CONSTITUTIONAL: Well-developed, well-nourished female in no acute distress.  HENT:  Normocephalic, atraumatic, Oropharynx is clear and moist EYES: Conjunctivae normal. No scleral icterus.  SKIN: Skin is warm and dry. No rash noted. Not diaphoretic. No erythema. No pallor. MUSCULOSKELETAL: Normal range of motion. No tenderness.  No cyanosis, clubbing, or edema.   NEUROLOGIC: Alert and oriented to person, place, and time. Normal muscle tone coordination.  PSYCHIATRIC: Normal mood and affect. Normal behavior. Normal judgment and thought content. CARDIOVASCULAR: Normal heart rate noted. RESPIRATORY: Nml rate and effort BREASTS: Declined ABDOMEN: Soft, normal bowel sounds, no distention noted.  No tenderness, rebound or guarding.  PELVIC: Normal appearing external genitalia; normal appearing vaginal mucosa. Cervix concave-appearing. No ectropion. No abnormal discharge noted.  Pap smear obtained.  Normal uterine size, no other  palpable masses, no uterine or adnexal tenderness.   Assessment and Plan:    1. Screening for cervical cancer - Requested that pt call her previous Gyn since that have not responded to our request for records.  - Cytology - PAP( [place])  2. Well woman exam with routine gynecological exam - F/U 1 year  3. History of gestational diabetes - GTT Nml  4. History of gestational hypertension -BP still mildly elevated on Lisinopril. Previously referred to PCP and list given.  5. History of abnormal cervical Pap smear - Requested that pt call her previous Gyn since that have not responded to our request for records.  - Cytology - PAP( [place])   Will follow up results of pap smear and manage accordingly. Mammogram not indicated Routine preventative health maintenance measures emphasized. Please refer to After Visit Summary for other counseling recommendations.  Manya Silvas, [place] for [employer], [place]

## 2022-08-25 LAB — CYTOLOGY - PAP
Comment: NEGATIVE
Comment: NEGATIVE
Comment: NEGATIVE
HPV 16: NEGATIVE
HPV 18 / 45: NEGATIVE
High risk HPV: POSITIVE — AB

## 2022-09-18 ENCOUNTER — Telehealth: Payer: Self-pay | Admitting: Family Medicine

## 2022-09-18 NOTE — Telephone Encounter (Signed)
Patient want a call, she have concerns about her Blood Pressure

## 2022-09-18 NOTE — Telephone Encounter (Signed)
Call returned from pt. Spoke with pt. Pt concerned with Blood pressure readings at home. Pt advised to make PCP appt. Pt states does not have one. Made PCP appt at [facility] on 10/13 at 235pm. Pt thankful for help getting it scheduled and aware of appt.  Colletta [place], RNC

## 2022-09-18 NOTE — Telephone Encounter (Signed)
Call placed to pt. No answer. Left VM advising pt to return call to office.  Colletta [place], RNC

## 2022-09-29 ENCOUNTER — Ambulatory Visit: Payer: Medicaid Other | Admitting: Family Medicine

## 2022-10-04 ENCOUNTER — Ambulatory Visit: Payer: Medicaid Other | Admitting: Family Medicine

## 2022-11-13 ENCOUNTER — Encounter: Payer: Self-pay | Admitting: Family Medicine

## 2022-11-13 ENCOUNTER — Ambulatory Visit: Payer: Medicaid Other | Admitting: Family Medicine

## 2022-11-13 VITALS — BP 137/87 | HR 115 | Ht 64.0 in | Wt 155.0 lb

## 2022-11-13 DIAGNOSIS — L659 Nonscarring hair loss, unspecified: Secondary | ICD-10-CM

## 2022-11-13 DIAGNOSIS — I10 Essential (primary) hypertension: Secondary | ICD-10-CM

## 2022-11-13 DIAGNOSIS — O24419 Gestational diabetes mellitus in pregnancy, unspecified control: Secondary | ICD-10-CM | POA: Diagnosis not present

## 2022-11-13 DIAGNOSIS — N393 Stress incontinence (female) (male): Secondary | ICD-10-CM | POA: Diagnosis not present

## 2022-11-13 DIAGNOSIS — F411 Generalized anxiety disorder: Secondary | ICD-10-CM

## 2022-11-13 DIAGNOSIS — K219 Gastro-esophageal reflux disease without esophagitis: Secondary | ICD-10-CM | POA: Insufficient documentation

## 2022-11-13 MED ORDER — ESOMEPRAZOLE MAGNESIUM 40 MG PO CPDR: 40.0000 mg | DELAYED_RELEASE_CAPSULE | Freq: Every day | ORAL | 3 refills | Status: AC

## 2022-11-13 MED ORDER — LOSARTAN POTASSIUM 25 MG PO TABS: 25.0000 mg | ORAL_TABLET | Freq: Every day | ORAL | 3 refills | Status: DC

## 2022-11-13 MED ORDER — ESCITALOPRAM OXALATE 10 MG PO TABS: 10.0000 mg | ORAL_TABLET | Freq: Every day | ORAL | 3 refills | Status: DC

## 2022-11-13 NOTE — Assessment & Plan Note (Signed)
-   have sent a referral to pelvic floor therapy. Pt has tried at home kegel exercises without improvement. Believe she would benefit from formal PT - if no better with PT then can consider GYN referral for bladder sling procedure eval.

## 2022-11-13 NOTE — Assessment & Plan Note (Addendum)
-   pt has allergy to lisinopril secondary to cough  - told pt to stop lisinopril immediately. Will start on losartan 25mg  daily and see if we can control her blood pressure with this. I am choosing an arb bc she has a hx of gestational diabetes and would like to provide renal protection - encouraged low salt diet  - will follow up in one month to see if BP is controlled.

## 2022-11-13 NOTE — Assessment & Plan Note (Signed)
-   discussed medical therapy and patient is willing to start medication. Will go ahead and start lexapro 10mg  daily  - follow up in 4 weeks for efficacy

## 2022-11-13 NOTE — Progress Notes (Signed)
Established patient visit   Patient: Emily [name]   DOB: 1986-05-30   36 y.o. Female  MRN: [id number] Visit Date: 11/13/2022  Today's healthcare provider: Charlton Amor, DO   Chief Complaint  Patient presents with   Establish Care    SUBJECTIVE    Chief Complaint  Patient presents with   Establish Care   HPI  Pt presents to establish care. She would like to address several concerns today.   HTN  She was diagnosed with HTN during pregnancy and it has continued. She was on nifedipine during pregnancy and post partum got bad headaches from it and was switched to lisinopril. Since being on lisinopril she has developed a dry cough. She was seen in the UC for this and told to try and take half of lisinopril. She is still coughing. Denies chest pain, shortness of breath, blurry vision.   Stress incontinence Pt admits to leakage of urine every time she coughs/sneezes/laughs. She has tried at home kegel exercises and has not found them helpful. She has had three vaginal deliveries and her most recent was 6 months ago.   Acid reflux  Pt is noting more acid reflux symptoms post partum.   Anxiety Pt notes an increase in anxiety. Her husband died 5 years ago and she has been on her own raising the kids. She recently got together with her high school friend. He did lose a job and she said they struggled financially. Does feel like she needs to go on a medication for control.   Review of Systems  Constitutional:  Negative for activity change, fatigue and fever.  Respiratory:  Negative for cough and shortness of breath.   Cardiovascular:  Negative for chest pain.  Gastrointestinal:  Negative for abdominal pain.  Genitourinary:  Negative for difficulty urinating.       Current Meds  Medication Sig   escitalopram (LEXAPRO) 10 MG tablet Take 1 tablet (10 mg total) by mouth daily.   esomeprazole (NEXIUM) 40 MG capsule Take 1 capsule (40 mg total) by mouth daily.   losartan (COZAAR)  25 MG tablet Take 1 tablet (25 mg total) by mouth daily.    OBJECTIVE    BP 137/87   Pulse (!) 115   Ht 5\' 4"  (1.626 m)   Wt 155 lb (70.3 kg)   SpO2 100%   BMI 26.61 kg/m   Physical Exam Vitals and nursing note reviewed.  Constitutional:      General: She is not in acute distress.    Appearance: Normal appearance.  HENT:     Head: Normocephalic and atraumatic.     Right Ear: External ear normal.     Left Ear: External ear normal.     Nose: Nose normal.  Eyes:     Conjunctiva/sclera: Conjunctivae normal.  Cardiovascular:     Rate and Rhythm: Normal rate and regular rhythm.  Pulmonary:     Effort: Pulmonary effort is normal.     Breath sounds: Normal breath sounds.  Neurological:     General: No focal deficit present.     Mental Status: She is alert and oriented to person, place, and time.  Psychiatric:        Mood and Affect: Mood normal.        Behavior: Behavior normal.        Thought Content: Thought content normal.        Judgment: Judgment normal.          ASSESSMENT &  PLAN    Problem List Items Addressed This Visit       Cardiovascular and Mediastinum   Primary hypertension - Primary    - pt has allergy to lisinopril secondary to cough  - told pt to stop lisinopril immediately. Will start on losartan 25mg  daily and see if we can control her blood pressure with this. I am choosing an arb bc she has a hx of gestational diabetes and would like to provide renal protection - encouraged low salt diet  - will follow up in one month to see if BP is controlled.       Relevant Medications   losartan (COZAAR) 25 MG tablet   Other Relevant Orders   Lipid panel   COMPLETE METABOLIC PANEL WITH GFR   CBC     Digestive   Gastroesophageal reflux disease without esophagitis    - have sent in nexium for patient for symptom control  - pt is not currently breastfeeding      Relevant Medications   esomeprazole (NEXIUM) 40 MG capsule     Endocrine   Gestational  diabetes mellitus (GDM), antepartum   Relevant Medications   losartan (COZAAR) 25 MG tablet   Other Relevant Orders   HgB A1c     Other   Stress incontinence of urine    - have sent a referral to pelvic floor therapy. Pt has tried at home kegel exercises without improvement. Believe she would benefit from formal PT - if no better with PT then can consider GYN referral for bladder sling procedure eval.      Relevant Orders   Ambulatory referral to Physical Therapy   Hair loss    - pt noting hair loss around crown of her head. Have ordered TSH to see if this is a thyroid abnormality  - could also be related to postpartum hormone changes  - recommended she take biotin to which she said she is already on - will follow up lab work      Relevant Orders   TSH + free T4   Generalized anxiety disorder    - discussed medical therapy and patient is willing to start medication. Will go ahead and start lexapro 10mg  daily  - follow up in 4 weeks for efficacy      Relevant Medications   escitalopram (LEXAPRO) 10 MG tablet    Return in about 4 weeks (around 12/11/2022).      Meds ordered this encounter  Medications   losartan (COZAAR) 25 MG tablet    Sig: Take 1 tablet (25 mg total) by mouth daily.    Dispense:  30 tablet    Refill:  3   esomeprazole (NEXIUM) 40 MG capsule    Sig: Take 1 capsule (40 mg total) by mouth daily.    Dispense:  30 capsule    Refill:  3   escitalopram (LEXAPRO) 10 MG tablet    Sig: Take 1 tablet (10 mg total) by mouth daily.    Dispense:  30 tablet    Refill:  3    Orders Placed This Encounter  Procedures   TSH + free T4   Lipid panel    Order Specific Question:   Has the patient fasted?    Answer:   No    Order Specific Question:   Release to patient    Answer:   Immediate   COMPLETE METABOLIC PANEL WITH GFR   CBC   HgB A1c   Ambulatory referral to Physical  Therapy    Referral Priority:   Routine    Referral Type:   Physical Medicine     Referral Reason:   Specialty Services Required    Requested Specialty:   Physical Therapy    Number of Visits Requested:   1     Charlton Amor, DO  [facility] [phone] (phone) [phone] (fax)  [facility]

## 2022-11-13 NOTE — Assessment & Plan Note (Signed)
-   have sent in nexium for patient for symptom control  - pt is not currently breastfeeding

## 2022-11-13 NOTE — Assessment & Plan Note (Signed)
-   pt noting hair loss around crown of her head. Have ordered TSH to see if this is a thyroid abnormality  - could also be related to postpartum hormone changes  - recommended she take biotin to which she said she is already on - will follow up lab work

## 2022-11-14 ENCOUNTER — Other Ambulatory Visit: Payer: Self-pay | Admitting: Family Medicine

## 2022-11-14 DIAGNOSIS — N393 Stress incontinence (female) (male): Secondary | ICD-10-CM

## 2022-11-14 LAB — CBC
HCT: 40.1 % (ref 35.0–45.0)
Hemoglobin: 13.9 g/dL (ref 11.7–15.5)
MCH: 29.6 pg (ref 27.0–33.0)
MCHC: 34.7 g/dL (ref 32.0–36.0)
MCV: 85.5 fL (ref 80.0–100.0)
MPV: 9.8 fL (ref 7.5–12.5)
Platelets: 346 10*3/uL (ref 140–400)
RBC: 4.69 10*6/uL (ref 3.80–5.10)
RDW: 13 % (ref 11.0–15.0)
WBC: 9.1 10*3/uL (ref 3.8–10.8)

## 2022-11-14 LAB — COMPLETE METABOLIC PANEL WITH GFR
AG Ratio: 1.8 (calc) (ref 1.0–2.5)
ALT: 24 U/L (ref 6–29)
AST: 19 U/L (ref 10–30)
Albumin: 4.6 g/dL (ref 3.6–5.1)
Alkaline phosphatase (APISO): 53 U/L (ref 31–125)
BUN: 12 mg/dL (ref 7–25)
CO2: 27 mmol/L (ref 20–32)
Calcium: 9.8 mg/dL (ref 8.6–10.2)
Chloride: 102 mmol/L (ref 98–110)
Creat: 0.71 mg/dL (ref 0.50–0.97)
Globulin: 2.6 g/dL (calc) (ref 1.9–3.7)
Glucose, Bld: 147 mg/dL — ABNORMAL HIGH (ref 65–99)
Potassium: 4.2 mmol/L (ref 3.5–5.3)
Sodium: 139 mmol/L (ref 135–146)
Total Bilirubin: 0.5 mg/dL (ref 0.2–1.2)
Total Protein: 7.2 g/dL (ref 6.1–8.1)
eGFR: 114 mL/min/{1.73_m2} (ref >=60)

## 2022-11-14 LAB — LIPID PANEL
Cholesterol: 200 mg/dL — ABNORMAL HIGH (ref <200)
HDL: 41 mg/dL — ABNORMAL LOW (ref >=50)
LDL Cholesterol (Calc): 111 mg/dL (calc) — ABNORMAL HIGH
Non-HDL Cholesterol (Calc): 159 mg/dL (calc) — ABNORMAL HIGH (ref <130)
Total CHOL/HDL Ratio: 4.9 (calc) (ref <5.0)
Triglycerides: 362 mg/dL — ABNORMAL HIGH (ref <150)

## 2022-11-14 LAB — HEMOGLOBIN A1C
Hgb A1c MFr Bld: 5.4 % of total Hgb (ref <5.7)
Mean Plasma Glucose: 108 mg/dL
eAG (mmol/L): 6 mmol/L

## 2022-11-14 LAB — TSH+FREE T4: TSH W/REFLEX TO FT4: 1.69 mIU/L

## 2022-11-15 NOTE — Therapy (Deleted)
OUTPATIENT PHYSICAL THERAPY FEMALE PELVIC EVALUATION   Patient Name: Emily [name] MRN: [id number] DOB:1986/03/26, 36 y.o., female Today's Date: 11/15/2022  END OF SESSION:   Past Medical History:  Diagnosis Date   Gestational diabetes    History of abnormal cervical Pap smear 08/21/2022   Hx some type of procedure for abnormal Pap in the past (some type of excision). Records request but not received.   History of gestational hypertension 08/21/2022   Past Surgical History:  Procedure Laterality Date   BREAST REDUCTION SURGERY  08/04/2020   CHOLECYSTECTOMY     TUBAL LIGATION N/A 06/01/2022   Procedure: POST PARTUM TUBAL LIGATION;  Surgeon: Myna Hidalgo, DO;  Location: [facility] LD ORS;  Service: Gynecology;  Laterality: N/A;   Patient Active Problem List   Diagnosis Date Noted   Primary hypertension 11/13/2022   Stress incontinence of urine 11/13/2022   Hair loss 11/13/2022   Gestational diabetes mellitus (GDM), antepartum 11/13/2022   Generalized anxiety disorder 11/13/2022   Gastroesophageal reflux disease without esophagitis 11/13/2022   History of gestational hypertension 08/21/2022   History of abnormal cervical Pap smear 08/21/2022   History of gestational diabetes 04/03/2022   Family history of cleft palate 01/22/2022   S/P bilateral breast reduction 11/29/2021    PCP: Everrett Coombe, DO   REFERRING PROVIDER: Charlton Amor, DO   REFERRING DIAG: N39.3 (ICD-10-CM) - Stress incontinence of urine   THERAPY DIAG:  No diagnosis found.  Rationale for Evaluation and Treatment: Rehabilitation  ONSET DATE: ***  SUBJECTIVE:                                                                                                                                                                                           SUBJECTIVE STATEMENT: *** Fluid intake: {Yes/No:304960894}   PAIN:  Are you having pain? {yes/no:20286} NPRS scale: ***/10 Pain location: {pelvic pain  location:27098}  Pain type: {type:313116} Pain description: {PAIN DESCRIPTION:21022940}   Aggravating factors: *** Relieving factors: ***  PRECAUTIONS: {Therapy precautions:24002}  WEIGHT BEARING RESTRICTIONS: {Yes ***/No:24003}  FALLS:  Has patient fallen in last 6 months? {fallsyesno:27318}  LIVING ENVIRONMENT: Lives with: {[facility] lives with:25569::"lives with their family"} Lives in: {Lives in:25570} Stairs: {opstairs:27293} Has following equipment at home: {Assistive devices:23999}  OCCUPATION: ***  PLOF: {PLOF:24004}  PATIENT GOALS: ***  PERTINENT HISTORY:  *** Sexual abuse: {Yes/No:304960894}  BOWEL MOVEMENT: Pain with bowel movement: {yes/no:20286} Type of bowel movement:{PT BM type:27100} Fully empty rectum: {Yes/No:304960894} Leakage: {Yes/No:304960894} Pads: {Yes/No:304960894} Fiber supplement: {Yes/No:304960894}  URINATION: Pain with urination: {yes/no:20286} Fully empty bladder: {Yes/No:304960894} Stream: {PT urination:27102} Urgency: {Yes/No:304960894} Frequency: *** Leakage: {PT leakage:27103} Pads: {Yes/No:304960894}  INTERCOURSE:  Pain with intercourse: {pain with intercourse PA:27099} Ability to have vaginal penetration:  {Yes/No:304960894} Climax: *** Marinoff Scale: ***/3  PREGNANCY: Vaginal deliveries *** Tearing {Yes***/No:304960894} C-section deliveries *** Currently pregnant {Yes***/No:304960894}  PROLAPSE: {PT prolapse:27101}   OBJECTIVE:   DIAGNOSTIC FINDINGS:  ***  PATIENT SURVEYS:  {rehab surveys:24030}  PFIQ-7 ***  COGNITION: Overall cognitive status: {cognition:24006}     SENSATION: Light touch: {intact/deficits:24005} Proprioception: {intact/deficits:24005}  MUSCLE LENGTH: Hamstrings: Right *** deg; Left *** deg Thomas test: Right *** deg; Left *** deg  LUMBAR SPECIAL TESTS:  {lumbar special test:25242}  FUNCTIONAL TESTS:  {Functional tests:24029}  GAIT: Distance walked: *** Assistive device  utilized: {Assistive devices:23999} Level of assistance: {Levels of assistance:24026} Comments: ***  POSTURE: {posture:25561}  PELVIC ALIGNMENT:  LUMBARAROM/PROM:  A/PROM A/PROM  eval  Flexion   Extension   Right lateral flexion   Left lateral flexion   Right rotation   Left rotation    (Blank rows = not tested)  LOWER EXTREMITY ROM:  {AROM/PROM:27142} ROM Right eval Left eval  Hip flexion    Hip extension    Hip abduction    Hip adduction    Hip internal rotation    Hip external rotation    Knee flexion    Knee extension    Ankle dorsiflexion    Ankle plantarflexion    Ankle inversion    Ankle eversion     (Blank rows = not tested)  LOWER EXTREMITY MMT:  MMT Right eval Left eval  Hip flexion    Hip extension    Hip abduction    Hip adduction    Hip internal rotation    Hip external rotation    Knee flexion    Knee extension    Ankle dorsiflexion    Ankle plantarflexion    Ankle inversion    Ankle eversion     PALPATION:   General  ***                External Perineal Exam ***                             Internal Pelvic Floor ***  Patient confirms identification and approves PT to assess internal pelvic floor and treatment {yes/no:20286}  PELVIC MMT:   MMT eval  Vaginal   Internal Anal Sphincter   External Anal Sphincter   Puborectalis   Diastasis Recti   (Blank rows = not tested)        TONE: ***  PROLAPSE: ***  TODAY'S TREATMENT:                                                                                                                              DATE: ***  EVAL ***   PATIENT EDUCATION:  Education details: *** Person educated: {Person educated:25204} Education method: {Education Method:25205} Education comprehension: {Education Comprehension:25206}  HOME EXERCISE PROGRAM: ***  ASSESSMENT:  CLINICAL IMPRESSION: Patient  is a *** y.o. *** who was seen today for physical therapy evaluation and treatment for ***.    OBJECTIVE IMPAIRMENTS: {opptimpairments:25111}.   ACTIVITY LIMITATIONS: {activitylimitations:27494}  PARTICIPATION LIMITATIONS: {participationrestrictions:25113}  PERSONAL FACTORS: {Personal factors:25162} are also affecting patient's functional outcome.   REHAB POTENTIAL: {rehabpotential:25112}  CLINICAL DECISION MAKING: {clinical decision making:25114}  EVALUATION COMPLEXITY: {Evaluation complexity:25115}   GOALS: Goals reviewed with patient? {yes/no:20286}  SHORT TERM GOALS: Target date: ***  *** Baseline: Goal status: {GOALSTATUS:25110}  2.  *** Baseline:  Goal status: {GOALSTATUS:25110}  3.  *** Baseline:  Goal status: {GOALSTATUS:25110}  4.  *** Baseline:  Goal status: {GOALSTATUS:25110}  5.  *** Baseline:  Goal status: {GOALSTATUS:25110}  6.  *** Baseline:  Goal status: {GOALSTATUS:25110}  LONG TERM GOALS: Target date: ***  *** Baseline:  Goal status: {GOALSTATUS:25110}  2.  *** Baseline:  Goal status: {GOALSTATUS:25110}  3.  *** Baseline:  Goal status: {GOALSTATUS:25110}  4.  *** Baseline:  Goal status: {GOALSTATUS:25110}  5.  *** Baseline:  Goal status: {GOALSTATUS:25110}  6.  *** Baseline:  Goal status: {GOALSTATUS:25110}  PLAN:  PT FREQUENCY: {rehab frequency:25116}  PT DURATION: {rehab duration:25117}  PLANNED INTERVENTIONS: {rehab planned interventions:25118::"Therapeutic exercises","Therapeutic activity","Neuromuscular re-education","Balance training","Gait training","Patient/Family education","Self Care","Joint mobilization"}  PLAN FOR NEXT SESSION: ***   Brayton Caves Otha Monical, PT 11/15/2022, 4:35 PM

## 2022-11-16 ENCOUNTER — Ambulatory Visit: Payer: Medicaid Other | Attending: Family Medicine | Admitting: Physical Therapy

## 2022-11-20 ENCOUNTER — Telehealth: Payer: Self-pay

## 2022-11-20 ENCOUNTER — Other Ambulatory Visit: Payer: Self-pay | Admitting: Family Medicine

## 2022-11-20 MED ORDER — LOSARTAN POTASSIUM 50 MG PO TABS: 50.0000 mg | ORAL_TABLET | Freq: Every day | ORAL | 2 refills | Status: DC

## 2022-11-20 NOTE — Progress Notes (Signed)
Pt showed up to clinic asking for losartan 50mg  pills. Have sent to pharmacy. Sounds like she does not enough of the 25mg  tablets to take two

## 2022-11-20 NOTE — Telephone Encounter (Signed)
Forwarding

## 2022-11-20 NOTE — Telephone Encounter (Signed)
Forwarding to Dr. Tamera Punt.

## 2022-11-20 NOTE — Telephone Encounter (Signed)
Pt states the blood pressure medication losartan 25 mg ,doesn't work well, pt states her bp is till high  at 170/96 this morning, pt states she was previously on lisinopril and that gave her a bad cough, pt would a higher dose of losartan after she spoke with the pharmacist about the starting dose which was suggested to be at  50 mg , pt states  she has some dizziness  and her ranging bp is 140's over 90's.  Please advise

## 2022-11-20 NOTE — Telephone Encounter (Signed)
Pended RX for blood pressure cuff

## 2022-11-20 NOTE — Telephone Encounter (Signed)
Patient came into office to see if she could get a BP cuff so that she could check her BP, patient states that her insurance informed her that her insurance will cover, please advise, thanks!!

## 2022-11-21 MED ORDER — BLOOD PRESSURE CUFF MISC: 1.0000 | Freq: Every day | 0 refills | Status: DC

## 2022-11-22 ENCOUNTER — Ambulatory Visit: Payer: Medicaid Other | Admitting: Family Medicine

## 2022-11-23 ENCOUNTER — Encounter (HOSPITAL_COMMUNITY): Payer: Self-pay

## 2022-11-23 ENCOUNTER — Emergency Department (HOSPITAL_COMMUNITY)
Admission: EM | Admit: 2022-11-23 | Discharge: 2022-11-23 | Disposition: A | Discharge status: Home / Self Care | Payer: Medicaid Other | Attending: Student | Admitting: Student

## 2022-11-23 DIAGNOSIS — M549 Dorsalgia, unspecified: Secondary | ICD-10-CM | POA: Diagnosis present

## 2022-11-23 DIAGNOSIS — Z5321 Procedure and treatment not carried out due to patient leaving prior to being seen by health care provider: Secondary | ICD-10-CM | POA: Insufficient documentation

## 2022-11-23 LAB — CBC WITH DIFFERENTIAL/PLATELET
Abs Immature Granulocytes: 0.03 10*3/uL (ref 0.00–0.07)
Basophils Absolute: 0 10*3/uL (ref 0.0–0.1)
Basophils Relative: 0 %
Eosinophils Absolute: 0.2 10*3/uL (ref 0.0–0.5)
Eosinophils Relative: 2 %
HCT: 43.3 % (ref 36.0–46.0)
Hemoglobin: 14.6 g/dL (ref 12.0–15.0)
Immature Granulocytes: 0 %
Lymphocytes Relative: 21 %
Lymphs Abs: 1.8 10*3/uL (ref 0.7–4.0)
MCH: 29.5 pg (ref 26.0–34.0)
MCHC: 33.7 g/dL (ref 30.0–36.0)
MCV: 87.5 fL (ref 80.0–100.0)
Monocytes Absolute: 0.7 10*3/uL (ref 0.1–1.0)
Monocytes Relative: 8 %
Neutro Abs: 6 10*3/uL (ref 1.7–7.7)
Neutrophils Relative %: 69 %
Platelets: 270 10*3/uL (ref 150–400)
RBC: 4.95 MIL/uL (ref 3.87–5.11)
RDW: 12.9 % (ref 11.5–15.5)
WBC: 8.7 10*3/uL (ref 4.0–10.5)
nRBC: 0 % (ref 0.0–0.2)

## 2022-11-23 LAB — BASIC METABOLIC PANEL
Anion gap: 9 (ref 5–15)
BUN: 13 mg/dL (ref 6–20)
CO2: 22 mmol/L (ref 22–32)
Calcium: 9.2 mg/dL (ref 8.9–10.3)
Chloride: 103 mmol/L (ref 98–111)
Creatinine, Ser: 0.64 mg/dL (ref 0.44–1.00)
GFR, Estimated: >60 mL/min (ref >60)
Glucose, Bld: 100 mg/dL — ABNORMAL HIGH (ref 70–99)
Potassium: 3.8 mmol/L (ref 3.5–5.1)
Sodium: 134 mmol/L — ABNORMAL LOW (ref 135–145)

## 2022-11-23 NOTE — ED Triage Notes (Signed)
Pt arrived via POV, c/o left sided flank and back pain. Denies any n/v, diarrhea, fevers, chills or urinary sx.

## 2022-11-23 NOTE — ED Provider Triage Note (Signed)
Emergency Medicine Provider Triage Evaluation Note  Emily [name] , a 36 y.o. female  was evaluated in triage.  Pt complains of intermittent and sharp left flank pain since today.  Also reports being recently ill due to her sons having RSV.  No dysuria, hematuria, history of kidney stone.  Review of Systems  Positive:  Negative:   Physical Exam  BP (!) 136/96   Pulse (!) 108   Temp 99.5 F (37.5 C) (Oral)   Resp 20   SpO2 100%  Gen:   Awake, no distress   Resp:  Normal effort  MSK:   Moves extremities without difficulty  Other:  Negative CVA bilaterally, nontender abdomen.  Resting comfortably.  Medical Decision Making  Medically screening exam initiated at 1:39 PM.  Appropriate orders placed.  Emily [name] was informed that the remainder of the evaluation will be completed by another provider, this initial triage assessment does not replace that evaluation, and the importance of remaining in the ED until their evaluation is complete.     Saddie Benders, PA-C 11/23/22 1340

## 2022-11-24 ENCOUNTER — Telehealth: Payer: Self-pay

## 2022-11-24 NOTE — Telephone Encounter (Signed)
Transition Care Management Follow-up Telephone Call Date of discharge and from where: [facility] 11/23/2022 How have you been since you were released from the hospital? No better Any questions or concerns? No  Items Reviewed: Did the pt receive and understand the discharge instructions provided? Yes  Medications obtained and verified? Yes  Other? no Any new allergies since your discharge? No  Dietary orders reviewed? Yes Do you have support at home? Yes   Home Care and Equipment/Supplies: Were home health services ordered? no If so, what is the name of the agency? N/a  Has the agency set up a time to come to the patient's home? not applicable Were any new equipment or medical supplies ordered?  No What is the name of the medical supply agency? N/a Were you able to get the supplies/equipment? not applicable Do you have any questions related to the use of the equipment or supplies? No  Functional Questionnaire: (I = Independent and D = Dependent) ADLs: I  Bathing/Dressing- I  Meal Prep- I  Eating- I  Maintaining continence- I  Transferring/Ambulation- I  Managing Meds- I  Follow up appointments reviewed:  PCP Hospital f/u appt confirmed? No  No avail. Appt times. Message sent to staff to schedule Specialist Hospital f/u appt confirmed? No   Are transportation arrangements needed? No  If their condition worsens, is the pt aware to call PCP or go to the Emergency Dept.? Yes Was the patient provided with contact information for the PCP's office or ED? Yes Was to pt encouraged to call back with questions or concerns? Yes Karena Addison, LPN [facility] Nurse Health Advisor Direct Dial [phone]

## 2022-11-27 NOTE — Therapy (Deleted)
OUTPATIENT PHYSICAL THERAPY FEMALE PELVIC EVALUATION   Patient Name: Emily [name] MRN: [id number] DOB:[birth year], 36 y.o., female Today's Date: 11/27/2022  END OF SESSION:   Past Medical History:  Diagnosis Date   Gestational diabetes    History of abnormal cervical Pap smear 08/21/2022   Hx some type of procedure for abnormal Pap in the past (some type of excision). Records request but not received.   History of gestational hypertension 08/21/2022   Past Surgical History:  Procedure Laterality Date   BREAST REDUCTION SURGERY  08/04/2020   CHOLECYSTECTOMY     TUBAL LIGATION N/A 06/01/2022   Procedure: POST PARTUM TUBAL LIGATION;  Surgeon: Myna Hidalgo, DO;  Location: [facility] LD ORS;  Service: Gynecology;  Laterality: N/A;   Patient Active Problem List   Diagnosis Date Noted   Primary hypertension 11/13/2022   Stress incontinence of urine 11/13/2022   Hair loss 11/13/2022   Gestational diabetes mellitus (GDM), antepartum 11/13/2022   Generalized anxiety disorder 11/13/2022   Gastroesophageal reflux disease without esophagitis 11/13/2022   History of gestational hypertension 08/21/2022   History of abnormal cervical Pap smear 08/21/2022   History of gestational diabetes 04/03/2022   Family history of cleft palate 01/22/2022   S/P bilateral breast reduction 11/29/2021    PCP: ***  REFERRING PROVIDER: Charlton Amor, DO   REFERRING DIAG: N39.3 (ICD-10-CM) - Stress incontinence of urine   THERAPY DIAG:  No diagnosis found.  Rationale for Evaluation and Treatment: Rehabilitation  ONSET DATE: ***  SUBJECTIVE:                                                                                                                                                                                           SUBJECTIVE STATEMENT: *** Fluid intake: {Yes/No:304960894}   PAIN:  Are you having pain? {yes/no:20286} NPRS scale: ***/10 Pain location: {pelvic pain location:27098}  Pain type:  {type:313116} Pain description: {PAIN DESCRIPTION:21022940}   Aggravating factors: *** Relieving factors: ***  PRECAUTIONS: {Therapy precautions:24002}  WEIGHT BEARING RESTRICTIONS: {Yes ***/No:24003}  FALLS:  Has patient fallen in last 6 months? {fallsyesno:27318}  LIVING ENVIRONMENT: Lives with: {[facility] lives with:25569::"lives with their family"} Lives in: {Lives in:25570} Stairs: {opstairs:27293} Has following equipment at home: {Assistive devices:23999}  OCCUPATION: ***  PLOF: {PLOF:24004}  PATIENT GOALS: ***  PERTINENT HISTORY:  *** Sexual abuse: {Yes/No:304960894}  BOWEL MOVEMENT: Pain with bowel movement: {yes/no:20286} Type of bowel movement:{PT BM type:27100} Fully empty rectum: {Yes/No:304960894} Leakage: {Yes/No:304960894} Pads: {Yes/No:304960894} Fiber supplement: {Yes/No:304960894}  URINATION: Pain with urination: {yes/no:20286} Fully empty bladder: {Yes/No:304960894} Stream: {PT urination:27102} Urgency: {Yes/No:304960894} Frequency: *** Leakage: {PT leakage:27103} Pads: {Yes/No:304960894}  INTERCOURSE: Pain with intercourse: {  pain with intercourse PA:27099} Ability to have vaginal penetration:  {Yes/No:304960894} Climax: *** Marinoff Scale: ***/3  PREGNANCY: Vaginal deliveries *** Tearing {Yes***/No:304960894} C-section deliveries *** Currently pregnant {Yes***/No:304960894}  PROLAPSE: {PT prolapse:27101}   OBJECTIVE:   DIAGNOSTIC FINDINGS:  ***  PATIENT SURVEYS:  {rehab surveys:24030}  PFIQ-7 ***  COGNITION: Overall cognitive status: {cognition:24006}     SENSATION: Light touch: {intact/deficits:24005} Proprioception: {intact/deficits:24005}  MUSCLE LENGTH: Hamstrings: Right *** deg; Left *** deg Thomas test: Right *** deg; Left *** deg  LUMBAR SPECIAL TESTS:  {lumbar special test:25242}  FUNCTIONAL TESTS:  {Functional tests:24029}  GAIT: Distance walked: *** Assistive device utilized: {Assistive  devices:23999} Level of assistance: {Levels of assistance:24026} Comments: ***  POSTURE: {posture:25561}  PELVIC ALIGNMENT:  LUMBARAROM/PROM:  A/PROM A/PROM  eval  Flexion   Extension   Right lateral flexion   Left lateral flexion   Right rotation   Left rotation    (Blank rows = not tested)  LOWER EXTREMITY ROM:  {AROM/PROM:27142} ROM Right eval Left eval  Hip flexion    Hip extension    Hip abduction    Hip adduction    Hip internal rotation    Hip external rotation    Knee flexion    Knee extension    Ankle dorsiflexion    Ankle plantarflexion    Ankle inversion    Ankle eversion     (Blank rows = not tested)  LOWER EXTREMITY MMT:  MMT Right eval Left eval  Hip flexion    Hip extension    Hip abduction    Hip adduction    Hip internal rotation    Hip external rotation    Knee flexion    Knee extension    Ankle dorsiflexion    Ankle plantarflexion    Ankle inversion    Ankle eversion     PALPATION:   General  ***                External Perineal Exam ***                             Internal Pelvic Floor ***  Patient confirms identification and approves PT to assess internal pelvic floor and treatment {yes/no:20286}  PELVIC MMT:   MMT eval  Vaginal   Internal Anal Sphincter   External Anal Sphincter   Puborectalis   Diastasis Recti   (Blank rows = not tested)        TONE: ***  PROLAPSE: ***  TODAY'S TREATMENT:                                                                                                                              DATE: ***  EVAL ***   PATIENT EDUCATION:  Education details: *** Person educated: {Person educated:25204} Education method: {Education Method:25205} Education comprehension: {Education Comprehension:25206}  HOME EXERCISE PROGRAM: ***  ASSESSMENT:  CLINICAL IMPRESSION: Patient is a ***  y.o. *** who was seen today for physical therapy evaluation and treatment for ***.   OBJECTIVE  IMPAIRMENTS: {opptimpairments:25111}.   ACTIVITY LIMITATIONS: {activitylimitations:27494}  PARTICIPATION LIMITATIONS: {participationrestrictions:25113}  PERSONAL FACTORS: {Personal factors:25162} are also affecting patient's functional outcome.   REHAB POTENTIAL: {rehabpotential:25112}  CLINICAL DECISION MAKING: {clinical decision making:25114}  EVALUATION COMPLEXITY: {Evaluation complexity:25115}   GOALS: Goals reviewed with patient? {yes/no:20286}  SHORT TERM GOALS: Target date: ***  *** Baseline: Goal status: {GOALSTATUS:25110}  2.  *** Baseline:  Goal status: {GOALSTATUS:25110}  3.  *** Baseline:  Goal status: {GOALSTATUS:25110}  4.  *** Baseline:  Goal status: {GOALSTATUS:25110}  5.  *** Baseline:  Goal status: {GOALSTATUS:25110}  6.  *** Baseline:  Goal status: {GOALSTATUS:25110}  LONG TERM GOALS: Target date: ***  *** Baseline:  Goal status: {GOALSTATUS:25110}  2.  *** Baseline:  Goal status: {GOALSTATUS:25110}  3.  *** Baseline:  Goal status: {GOALSTATUS:25110}  4.  *** Baseline:  Goal status: {GOALSTATUS:25110}  5.  *** Baseline:  Goal status: {GOALSTATUS:25110}  6.  *** Baseline:  Goal status: {GOALSTATUS:25110}  PLAN:  PT FREQUENCY: {rehab frequency:25116}  PT DURATION: {rehab duration:25117}  PLANNED INTERVENTIONS: {rehab planned interventions:25118::"Therapeutic exercises","Therapeutic activity","Neuromuscular re-education","Balance training","Gait training","Patient/Family education","Self Care","Joint mobilization"}  PLAN FOR NEXT SESSION: ***   Brayton Caves Justun Anaya, PT 11/27/2022, 9:22 AM

## 2022-11-29 ENCOUNTER — Ambulatory Visit: Payer: Medicaid Other | Attending: Family Medicine | Admitting: Physical Therapy

## 2022-12-04 ENCOUNTER — Ambulatory Visit: Payer: Medicaid Other

## 2022-12-05 ENCOUNTER — Ambulatory Visit: Payer: Medicaid Other | Admitting: Family Medicine

## 2022-12-05 ENCOUNTER — Other Ambulatory Visit: Payer: Self-pay | Admitting: Family Medicine

## 2022-12-05 DIAGNOSIS — F411 Generalized anxiety disorder: Secondary | ICD-10-CM

## 2022-12-06 ENCOUNTER — Ambulatory Visit (INDEPENDENT_AMBULATORY_CARE_PROVIDER_SITE_OTHER): Payer: Medicaid Other | Admitting: Family Medicine

## 2022-12-06 VITALS — BP 128/87 | HR 112 | Resp 20

## 2022-12-06 DIAGNOSIS — I10 Essential (primary) hypertension: Secondary | ICD-10-CM

## 2022-12-06 NOTE — Progress Notes (Signed)
Medical screening examination/treatment was performed by qualified clinical staff member and as supervising physician I was immediately available for consultation/collaboration. I have reviewed documentation and agree with assessment and plan.  Christian Borgerding, DO  

## 2022-12-06 NOTE — Progress Notes (Signed)
   Subjective:    Patient ID: Emily [name], female    DOB: 07-18-1986, 36 y.o.   MRN: [id number]  HPI Patient is here for blood pressure check. Denies trouble sleeping or palpitations. She has been having elevated blood pressures and have been to the Emergency room twice in the past two weeks for high blood pressure and headaches in the back of her head.   Review of Systems     Objective:   Physical Exam        Assessment & Plan:   Patient has an upcoming appointment with Tandy Gaw on 12/19/2022.   I spoke with Dr. Ashley Royalty and he advised that she stay hydrated and keep her appointment with Tandy Gaw to discuss her concerns for her headaches and her high pulse rates.

## 2022-12-08 ENCOUNTER — Ambulatory Visit: Payer: Medicaid Other | Admitting: Family Medicine

## 2022-12-19 ENCOUNTER — Ambulatory Visit: Payer: Medicaid Other | Admitting: Physician Assistant

## 2022-12-19 ENCOUNTER — Encounter: Payer: Self-pay | Admitting: Physician Assistant

## 2022-12-19 VITALS — BP 130/84 | HR 99 | Ht 64.0 in | Wt 151.0 lb

## 2022-12-19 DIAGNOSIS — R052 Subacute cough: Secondary | ICD-10-CM | POA: Diagnosis not present

## 2022-12-19 MED ORDER — PREDNISONE 50 MG PO TABS: ORAL_TABLET | ORAL | 0 refills | Status: DC

## 2022-12-19 NOTE — Progress Notes (Signed)
Established Patient Office Visit  Subjective   Patient ID: Emily [name], female    DOB: 09/06/1986  Age: 37 y.o. MRN: [id number]  Chief Complaint  Patient presents with   Hypertension   Headache    Heart rate    HPI Pt is a 37 yo female with pmh of gestational diabetes and post partum HTN who presents to the clinic for cough for the last month. She denies any URI symptoms,SOB, body aches, ST. Cough is dry and waxes and wanes. She did have cough with lisinopril. Denies any GERD symptoms that are out of control.    .. Active Ambulatory Problems    Diagnosis Date Noted   S/P bilateral breast reduction 11/29/2021   Family history of cleft palate 01/22/2022   History of gestational diabetes 04/03/2022   History of gestational hypertension 08/21/2022   History of abnormal cervical Pap smear 08/21/2022   Primary hypertension 11/13/2022   Stress incontinence of urine 11/13/2022   Hair loss 11/13/2022   Generalized anxiety disorder 11/13/2022   Gastroesophageal reflux disease without esophagitis 11/13/2022   Resolved Ambulatory Problems    Diagnosis Date Noted   Supervision of high risk pregnancy, antepartum 11/15/2021   Low lying placenta nos or without hemorrhage, second trimester 01/30/2022   GDM (gestational diabetes mellitus) 04/04/2022   No-show for appointment 04/18/2022   Gestational diabetes mellitus (GDM) controlled on oral hypoglycemic drug 05/30/2022   SVD (spontaneous vaginal delivery) 05/30/2022   Gestational diabetes mellitus (GDM), antepartum 11/13/2022   Past Medical History:  Diagnosis Date   Gestational diabetes    Hypertension      ROS See HPI.    Objective:     BP 130/84 (BP Location: Left Arm, Patient Position: Sitting, Cuff Size: Large)   Pulse 99   Ht 5\' 4"  (1.626 m)   Wt 151 lb 0.6 oz (68.5 kg)   SpO2 97%   BMI 25.93 kg/m  BP Readings from Last 3 Encounters:  12/20/22 (!) 147/104  12/20/22 (!) 141/100  12/19/22 130/84   Wt Readings  from Last 3 Encounters:  12/20/22 154 lb 8 oz (70.1 kg)  12/19/22 151 lb 0.6 oz (68.5 kg)  11/13/22 155 lb (70.3 kg)      Physical Exam Constitutional:      Appearance: She is well-developed.  HENT:     Head: Normocephalic.     Mouth/Throat:     Mouth: Mucous membranes are moist.  Eyes:     Extraocular Movements: Extraocular movements intact.     Pupils: Pupils are equal, round, and reactive to light.  Cardiovascular:     Rate and Rhythm: Normal rate and regular rhythm.  Pulmonary:     Effort: Pulmonary effort is normal.     Breath sounds: Normal breath sounds.  Abdominal:     Palpations: Abdomen is soft.  Musculoskeletal:     Cervical back: Normal range of motion.  Neurological:     Mental Status: She is alert and oriented to person, place, and time.  Psychiatric:        Mood and Affect: Mood normal.        Assessment & Plan:  Marland KitchenMarland KitchenDavielle was seen today for hypertension and headache.  Diagnoses and all orders for this visit:  Subacute cough -     predniSONE (DELTASONE) 50 MG tablet; One tab PO daily for 5 days.    Post viral cough vs cough related to cozaar which is not as likely but did have with lisinopril PE  no abnormal findings Start prednisone for 5 days if not improving will stop losartan and try another blood pressure medication.    Iran Planas, PA-C

## 2022-12-20 ENCOUNTER — Other Ambulatory Visit: Payer: Self-pay

## 2022-12-20 ENCOUNTER — Ambulatory Visit (INDEPENDENT_AMBULATORY_CARE_PROVIDER_SITE_OTHER): Payer: Medicaid Other | Admitting: Obstetrics & Gynecology

## 2022-12-20 ENCOUNTER — Emergency Department (HOSPITAL_COMMUNITY)
Admission: EM | Admit: 2022-12-20 | Discharge: 2022-12-20 | Disposition: A | Discharge status: Home / Self Care | Payer: Medicaid Other | Attending: Emergency Medicine | Admitting: Emergency Medicine

## 2022-12-20 ENCOUNTER — Encounter: Payer: Self-pay | Admitting: Obstetrics & Gynecology

## 2022-12-20 ENCOUNTER — Other Ambulatory Visit (HOSPITAL_COMMUNITY)
Admission: RE | Admit: 2022-12-20 | Discharge: 2022-12-20 | Disposition: A | Discharge status: Home / Self Care | Payer: Medicaid Other | Source: Ambulatory Visit | Attending: Family Medicine | Admitting: Family Medicine

## 2022-12-20 VITALS — BP 141/100 | HR 93 | Ht 60.0 in | Wt 154.5 lb

## 2022-12-20 DIAGNOSIS — R87612 Low grade squamous intraepithelial lesion on cytologic smear of cervix (LGSIL): Secondary | ICD-10-CM | POA: Diagnosis not present

## 2022-12-20 DIAGNOSIS — I10 Essential (primary) hypertension: Secondary | ICD-10-CM | POA: Insufficient documentation

## 2022-12-20 DIAGNOSIS — R519 Headache, unspecified: Secondary | ICD-10-CM | POA: Diagnosis present

## 2022-12-20 DIAGNOSIS — N870 Mild cervical dysplasia: Secondary | ICD-10-CM

## 2022-12-20 DIAGNOSIS — Z79899 Other long term (current) drug therapy: Secondary | ICD-10-CM | POA: Diagnosis not present

## 2022-12-20 LAB — URINALYSIS, ROUTINE W REFLEX MICROSCOPIC
Bacteria, UA: NONE SEEN
Bilirubin Urine: NEGATIVE
Glucose, UA: NEGATIVE mg/dL
Ketones, ur: NEGATIVE mg/dL
Leukocytes,Ua: NEGATIVE
Nitrite: NEGATIVE
Protein, ur: NEGATIVE mg/dL
Specific Gravity, Urine: 1.019 (ref 1.005–1.030)
pH: 5 (ref 5.0–8.0)

## 2022-12-20 NOTE — ED Triage Notes (Signed)
Pt via POV c/o "seeing spots" in her vision and high BP at home 174/100 that has been intermittent for the past two weeks. Pt endorses recent medication change from lisinopril to losartan approx 2 weeks ago d/t SE of cough and vomiting. No CP/SOB. No dizziness. Triage BP 147/104

## 2022-12-20 NOTE — Progress Notes (Signed)
Patient given informed consent, signed copy in the chart, time out was performed.  Placed in lithotomy position. Cervix viewed with speculum and colposcope after application of acetic acid.  LGSIL 08/18/22 Colposcopy adequate?  yes Acetowhite lesions?yes Punctation?no Mosaicism?  no Abnormal vasculature?  no Biopsies?3 and 8  ECC?yes Clear vaginal discharge no odor COMMENTS: Patient was given post procedure instructions.   Woodroe Mode, MD   She reports vaginal "leaking"/discharge for a few months. She thought this was just due to postpartum discharge but now that it has been 7 months she is starting to get more concerned. She is not currently breast feeding as she had a Bilateral breast reduction 2 years ago and wasn't able to produce enough milk. She denies dysuria, pruritus or odor. She has a history of stress incontinence but states this is different.

## 2022-12-20 NOTE — ED Provider Notes (Signed)
[facility] EMERGENCY DEPARTMENT Provider Note   CSN: [id number] Arrival date & time: 12/20/22  1717     History  Chief Complaint  Patient presents with   Hypertension    Emily [name] is a 37 y.o. female.  Patient is a 37 year old female with a past medical history of postpartum hypertension presenting to the emergency department with high blood pressure.  The patient states that she has been seen by her primary doctor and has been changed with blood pressure medications over the last few months.  She states that she was initially on lisinopril but switched to losartan due to a cough about 3 weeks ago.  She states that since then she has continued to have high blood pressure readings at home and gets intermittent right-sided posterior headaches and intermittent spots in her vision.  She states that today she was having the spots in her vision and noted that her blood pressure was in the 235T systolic that prompted her to come to the emergency department.  She states she did not have a headache today.  She denies any nausea, vomiting, numbness or weakness.  She denies any chest pain or shortness of breath.  She states that she was seen by her PCP yesterday and brought up these concerns but was not recommended any changes to her regimen.  Upon my evaluation, the patient reports she is asymptomatic at this time and no longer has the spots in her vision.  The history is provided by the patient.  Hypertension       Home Medications Prior to Admission medications   Medication Sig Start Date End Date Taking? Authorizing Provider  Blood Pressure Monitoring (BLOOD PRESSURE CUFF) MISC 1 Device by Does not apply route daily. Patient not taking: Reported on 12/20/2022 11/21/22   Owens Loffler, DO  escitalopram (LEXAPRO) 10 MG tablet TAKE 1 TABLET BY MOUTH EVERY DAY 12/06/22   Owens Loffler, DO  esomeprazole (NEXIUM) 40 MG capsule Take 1 capsule (40 mg total) by mouth daily.  11/13/22   Owens Loffler, DO  losartan (COZAAR) 50 MG tablet Take 1 tablet (50 mg total) by mouth daily. 11/20/22 02/18/23  Owens Loffler, DO  predniSONE (DELTASONE) 50 MG tablet One tab PO daily for 5 days. 12/19/22   Breeback, Royetta Car, PA-C  Prenatal Vit-Fe Fumarate-FA (PREPLUS) 27-1 MG TABS Take 1 tablet by mouth daily. 01/30/22   Leftwich-Kirby, Kathie Dike, CNM      Allergies    Lisinopril    Review of Systems   Review of Systems  Physical Exam Updated Vital Signs BP (!) 147/104 (BP Location: Right Arm)   Pulse 98   Temp 98.8 F (37.1 C) (Oral)   Resp 17   LMP 11/29/2022 (Approximate) Comment: lasted 4 days; heavy bleeding  SpO2 100%  Physical Exam Vitals and nursing note reviewed.  Constitutional:      General: She is not in acute distress.    Appearance: Normal appearance.  HENT:     Head: Normocephalic and atraumatic.     Nose: Nose normal.     Mouth/Throat:     Mouth: Mucous membranes are moist.     Pharynx: Oropharynx is clear.  Eyes:     Extraocular Movements: Extraocular movements intact.     Conjunctiva/sclera: Conjunctivae normal.     Pupils: Pupils are equal, round, and reactive to light.  Cardiovascular:     Rate and Rhythm: Normal rate and regular rhythm.  Heart sounds: Normal heart sounds.  Pulmonary:     Effort: Pulmonary effort is normal.     Breath sounds: Normal breath sounds.  Abdominal:     General: Abdomen is flat.     Palpations: Abdomen is soft.     Tenderness: There is no abdominal tenderness.  Musculoskeletal:        General: Normal range of motion.     Cervical back: Normal range of motion and neck supple.     Right lower leg: No edema.     Left lower leg: No edema.  Skin:    General: Skin is warm and dry.  Neurological:     General: No focal deficit present.     Mental Status: She is alert and oriented to person, place, and time.     Cranial Nerves: No cranial nerve deficit.     Sensory: No sensory deficit.     Motor: No weakness.      Coordination: Coordination normal.  Psychiatric:        Mood and Affect: Mood normal.        Behavior: Behavior normal.     ED Results / Procedures / Treatments   Labs (all labs ordered are listed, but only abnormal results are displayed) Labs Reviewed  URINALYSIS, ROUTINE W REFLEX MICROSCOPIC - Abnormal; Notable for the following components:      Result Value   Hgb urine dipstick SMALL (*)    All other components within normal limits    EKG None  Radiology No results found.  Procedures Procedures    Medications Ordered in ED Medications - No data to display  ED Course/ Medical Decision Making/ A&P                           Medical Decision Making This patient presents to the ED with chief complaint(s) of hypertension with pertinent past medical history of postpartum hypertension which further complicates the presenting complaint. The complaint involves an extensive differential diagnosis and also carries with it a high risk of complications and morbidity.    The differential diagnosis includes patient has no focal neurologic deficits making CVA unlikely, she has no active headache without neurologic deficits ICH or mass effect is unlikely, she has no chest pain or other signs of endorgan damage making hypertensive emergency unlikely  Additional history obtained: Additional history obtained from N/A Records reviewed Primary Care Documents  ED Course and Reassessment: Patient was initially evaluated by provider in triage and blood pressure performed here was in the 656C systolic.  Urine had no proteinuria and EKG had no acute ischemic changes.  She is at her neurologic baseline without any complaints at this time.  Patient is stable for discharge home with primary care follow-up.  She was given instructions on blood pressure monitoring and reading at home and was given strict return precautions.  Independent labs interpretation:  The following labs were independently  interpreted: UA negative  Independent visualization of imaging: N/A  Consultation: - Consulted or discussed management/test interpretation w/ external professional: N/a  Consideration for admission or further workup: Patient has no emergent conditions requiring admission or further work-up at this time and is stable for discharge home with primary care follow-up  Social Determinants of health: N/A            Final Clinical Impression(s) / ED Diagnoses Final diagnoses:  Hypertension, unspecified type    Rx / DC Orders ED Discharge Orders  None         Rexford Maus, [place] 12/20/22 2113

## 2022-12-20 NOTE — Discharge Instructions (Signed)
You were seen in the emergency department for your high blood pressure.  He had no signs of stroke or injury to your organs from her blood pressure being high.  You should continue to take your current blood pressure medication as prescribed and you can check your blood pressure daily.  I recommend checking it at the same time every day when you have been resting for at least 15 minutes and keep a log of your blood pressure readings each day.  If you are having any headaches or symptoms you can keep a log of this as well to see if your blood pressure elevation seems to be pain, anxiety or activity related.  You should follow-up with your primary doctor for blood pressure recheck and to determine if you need any changes to your medications.  You should return to the emergency department if you are having severe headache, repetitive vomiting, numbness or weakness on one side of the body compared to the other, loss of vision, confusion or seizure, severe chest pain or if you have any other new or concerning symptoms.

## 2022-12-20 NOTE — ED Provider Triage Note (Signed)
Emergency Medicine Provider Triage Evaluation Note  Emily [name] , a 37 y.o. female  was evaluated in triage.  Pt complains of hypertension.  Patient became concerned that she had elevated blood pressure and some vision changes.  She denies any current vision changes, headaches, shortness of breath, chest pain.  Reports that she was recently changed from lisinopril to losartan and has had blood pressure issues since then.  Review of Systems  Positive: As above Negative: As above  Physical Exam  BP (!) 147/104 (BP Location: Right Arm)   Pulse 98   Temp 98.8 F (37.1 C) (Oral)   Resp 17   LMP 11/29/2022 (Approximate) Comment: lasted 4 days; heavy bleeding  SpO2 100%  Gen:   Awake, no distress   Resp:  Normal effort  MSK:   Moves extremities without difficulty  Other:  Regular rate and rhythm on auscultation  Medical Decision Making  Medically screening exam initiated at 8:06 PM.  Appropriate orders placed.  CHIVON LEPAGE was informed that the remainder of the evaluation will be completed by another provider, this initial triage assessment does not replace that evaluation, and the importance of remaining in the ED until their evaluation is complete.     Luvenia Heller, PA-C 12/20/22 2008

## 2022-12-21 ENCOUNTER — Telehealth: Payer: Self-pay | Admitting: Neurology

## 2022-12-21 LAB — SURGICAL PATHOLOGY

## 2022-12-21 NOTE — Telephone Encounter (Signed)
Spoke with patient and appt make for nurse visit. She will bring her machine.

## 2022-12-21 NOTE — Telephone Encounter (Signed)
If you do not have any appointments available with the provider I would recommend a nurse visit and encouraged her to bring in her home cuff it sounds like there might be some variance between our machine and her machine and that we we can verify that really is accurate.  If it is running a little high that we have plenty of room to go up on her losartan.

## 2022-12-21 NOTE — Telephone Encounter (Signed)
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 She went to the ER last night due to BP readings at 171/100. She states she was having spots in her vision, headache, and dizziness.   She was switched from Lisinopril to Losartan 3 weeks ago due to cough. Currently taking Losartan 50 mg daily with no missed doses.   Today her blood pressure is reading 144/96. She did see Yamaira Spinner two days ago with normal BP reading in our office.   She is looking for advise. ER did not change anything and advised her to follow up here. [phone].

## 2022-12-21 NOTE — Progress Notes (Signed)
Repeat pap in 12 months

## 2022-12-22 ENCOUNTER — Telehealth: Payer: Self-pay | Admitting: General Practice

## 2022-12-22 ENCOUNTER — Telehealth: Payer: Self-pay

## 2022-12-22 ENCOUNTER — Ambulatory Visit (INDEPENDENT_AMBULATORY_CARE_PROVIDER_SITE_OTHER): Payer: Medicaid Other | Admitting: Family Medicine

## 2022-12-22 VITALS — BP 135/87 | HR 71 | Ht 64.0 in | Wt 158.0 lb

## 2022-12-22 DIAGNOSIS — I10 Essential (primary) hypertension: Secondary | ICD-10-CM

## 2022-12-22 DIAGNOSIS — N393 Stress incontinence (female) (male): Secondary | ICD-10-CM

## 2022-12-22 MED ORDER — LOSARTAN POTASSIUM 100 MG PO TABS: 100.0000 mg | ORAL_TABLET | Freq: Every day | ORAL | 1 refills | Status: DC

## 2022-12-22 NOTE — Patient Instructions (Addendum)
Please bring in your home BP readings. START TAKING LEXAPRO.

## 2022-12-22 NOTE — Telephone Encounter (Signed)
Transition Care Management Follow-up Telephone Call Date of discharge and from where: 12/20/22 from [facility] How have you been since you were released from the hospital? Patient has been in tough with triage and MD on call. Had nurse visit today and medication changes were made and a follow up was scheduled. Any questions or concerns? No

## 2022-12-22 NOTE — Progress Notes (Signed)
HTN - BPs still variable. Will inc losartan to 100mg .  F/U with PCP/Provider in 1 months.  Plan to do a BMP at that time.  Bring in home numbers.

## 2022-12-22 NOTE — Telephone Encounter (Signed)
Patient  was seen by Dr. Madilyn Fireman today, patient of Dr. Mel Almond, patient states that she needs a prescription for incontinence pads, please advise, thanks.

## 2022-12-22 NOTE — Progress Notes (Signed)
Pt presents today for BP check. She was recently switched from Lisinopril 10 mg to Losartan 50 mg 3 weeks ago due to a cough.   Pt states that she takes her medication between 630-700 am daily. She did bring in her home BP cuff and the reading in the office was 149/49 P: 78. She did check her blood pressure before coming in this morning and her reading was 127/85 P:75. She feels like her BP readings are better at night.   Pt said that she doesn't eat any salty or processed foods and she does try to exercise as much as possible. She has been sick recently so she has been limited so she has been using the stairs more at her apartment complex.     After sitting for 10 mins pt's BP was rechecked. Her home BP cuff reading was 138/104 P:77. I did recheck this for a 3rd time it was 124/113 P: 73.   Spoke to covering provider and was told to advise pt that she is going to increase the Losartan to 100 mg and that she should start the Lexapro 10 mg.   Pt told to f/u in 1 month with PCP/provider for BP and BMP. And to bring in her home readings.

## 2022-12-22 NOTE — Telephone Encounter (Signed)
Please call pt and let her know that she can purchase these OTC. She doesn't need a prescription.

## 2022-12-25 NOTE — Telephone Encounter (Signed)
Spoke with patient, patient states that her Insurance informed her they  will pay or them, if patient has prescription, please advise, thanks.

## 2022-12-25 NOTE — Telephone Encounter (Signed)
I can order but need a dx code. What type of incontinenec does she have.

## 2022-12-26 MED ORDER — INCONTINENCE SUPPLIES MISC: Status: AC

## 2022-12-26 NOTE — Telephone Encounter (Signed)
Rx printed. She can pick up at her convenience

## 2022-12-26 NOTE — Addendum Note (Signed)
Addended by: Beatrice Lecher D on: 12/26/2022 12:47 PM   Modules accepted: Orders

## 2022-12-26 NOTE — Addendum Note (Signed)
Addended by: Mertha Finders on: 12/26/2022 09:26 AM   Modules accepted: Orders

## 2022-12-26 NOTE — Telephone Encounter (Signed)
Per provider's note and patient - stress incontinence. Patient is currently schedule to complete PT for pelvic floor therapy. If unsuccessful, she was recommended that a bladder sling may help her with the issue. Rx pended.

## 2022-12-28 NOTE — Telephone Encounter (Signed)
Patient advised.

## 2023-01-12 ENCOUNTER — Other Ambulatory Visit: Payer: Self-pay

## 2023-01-12 ENCOUNTER — Emergency Department (HOSPITAL_COMMUNITY): Payer: Medicaid Other

## 2023-01-12 ENCOUNTER — Emergency Department (HOSPITAL_COMMUNITY)
Admission: EM | Admit: 2023-01-12 | Discharge: 2023-01-12 | Disposition: A | Discharge status: Home / Self Care | Payer: Medicaid Other | Attending: Emergency Medicine | Admitting: Emergency Medicine

## 2023-01-12 DIAGNOSIS — Z79899 Other long term (current) drug therapy: Secondary | ICD-10-CM | POA: Diagnosis not present

## 2023-01-12 DIAGNOSIS — J101 Influenza due to other identified influenza virus with other respiratory manifestations: Secondary | ICD-10-CM | POA: Insufficient documentation

## 2023-01-12 DIAGNOSIS — E860 Dehydration: Secondary | ICD-10-CM | POA: Insufficient documentation

## 2023-01-12 DIAGNOSIS — I10 Essential (primary) hypertension: Secondary | ICD-10-CM | POA: Insufficient documentation

## 2023-01-12 DIAGNOSIS — E876 Hypokalemia: Secondary | ICD-10-CM | POA: Insufficient documentation

## 2023-01-12 DIAGNOSIS — R059 Cough, unspecified: Secondary | ICD-10-CM | POA: Diagnosis present

## 2023-01-12 DIAGNOSIS — Z1152 Encounter for screening for COVID-19: Secondary | ICD-10-CM | POA: Insufficient documentation

## 2023-01-12 DIAGNOSIS — N179 Acute kidney failure, unspecified: Secondary | ICD-10-CM | POA: Diagnosis not present

## 2023-01-12 LAB — CBC WITH DIFFERENTIAL/PLATELET
Abs Immature Granulocytes: 0 10*3/uL (ref 0.00–0.07)
Basophils Absolute: 0 10*3/uL (ref 0.0–0.1)
Basophils Relative: 0 %
Eosinophils Absolute: 0 10*3/uL (ref 0.0–0.5)
Eosinophils Relative: 0 %
HCT: 36.6 % (ref 36.0–46.0)
Hemoglobin: 13.2 g/dL (ref 12.0–15.0)
Lymphocytes Relative: 46 %
Lymphs Abs: 2 10*3/uL (ref 0.7–4.0)
MCH: 30.4 pg (ref 26.0–34.0)
MCHC: 36.1 g/dL — ABNORMAL HIGH (ref 30.0–36.0)
MCV: 84.3 fL (ref 80.0–100.0)
Monocytes Absolute: 0.2 10*3/uL (ref 0.1–1.0)
Monocytes Relative: 5 %
Neutro Abs: 2.1 10*3/uL (ref 1.7–7.7)
Neutrophils Relative %: 49 %
Platelets: 206 10*3/uL (ref 150–400)
RBC: 4.34 MIL/uL (ref 3.87–5.11)
RDW: 13 % (ref 11.5–15.5)
WBC: 4.3 10*3/uL (ref 4.0–10.5)
nRBC: 0 % (ref 0.0–0.2)
nRBC: 0 /100 WBC

## 2023-01-12 LAB — COMPREHENSIVE METABOLIC PANEL
ALT: 78 U/L — ABNORMAL HIGH (ref 0–44)
AST: 73 U/L — ABNORMAL HIGH (ref 15–41)
Albumin: 3.8 g/dL (ref 3.5–5.0)
Alkaline Phosphatase: 39 U/L (ref 38–126)
Anion gap: 10 (ref 5–15)
BUN: 11 mg/dL (ref 6–20)
CO2: 20 mmol/L — ABNORMAL LOW (ref 22–32)
Calcium: 7.9 mg/dL — ABNORMAL LOW (ref 8.9–10.3)
Chloride: 108 mmol/L (ref 98–111)
Creatinine, Ser: 1.07 mg/dL — ABNORMAL HIGH (ref 0.44–1.00)
GFR, Estimated: >60 mL/min (ref >60)
Glucose, Bld: 116 mg/dL — ABNORMAL HIGH (ref 70–99)
Potassium: 3.1 mmol/L — ABNORMAL LOW (ref 3.5–5.1)
Sodium: 138 mmol/L (ref 135–145)
Total Bilirubin: 0.9 mg/dL (ref 0.3–1.2)
Total Protein: 6.3 g/dL — ABNORMAL LOW (ref 6.5–8.1)

## 2023-01-12 LAB — RESP PANEL BY RT-PCR (RSV, FLU A&B, COVID)  RVPGX2
Influenza A by PCR: NEGATIVE
Influenza B by PCR: POSITIVE — AB
Resp Syncytial Virus by PCR: NEGATIVE
SARS Coronavirus 2 by RT PCR: NEGATIVE

## 2023-01-12 LAB — GROUP A STREP BY PCR: Group A Strep by PCR: NOT DETECTED

## 2023-01-12 LAB — I-STAT BETA HCG BLOOD, ED (MC, WL, AP ONLY): I-stat hCG, quantitative: <5 m[IU]/mL (ref <5)

## 2023-01-12 LAB — MAGNESIUM: Magnesium: 2.1 mg/dL (ref 1.7–2.4)

## 2023-01-12 MED ORDER — SODIUM CHLORIDE 0.9 % IV BOLUS
1000.0000 mL | Freq: Once | INTRAVENOUS | Status: AC
  Administered 2023-01-12: 1000 mL via INTRAVENOUS

## 2023-01-12 MED ORDER — POTASSIUM CHLORIDE 10 MEQ/100ML IV SOLN
10.0000 meq | Freq: Once | INTRAVENOUS | Status: AC
  Administered 2023-01-12: 10 meq via INTRAVENOUS
  Filled 2023-01-12: qty 100

## 2023-01-12 MED ORDER — ONDANSETRON HCL 4 MG/2ML IJ SOLN
4.0000 mg | Freq: Once | INTRAMUSCULAR | Status: AC
  Administered 2023-01-12: 4 mg via INTRAVENOUS
  Filled 2023-01-12: qty 2

## 2023-01-12 MED ORDER — KETOROLAC TROMETHAMINE 30 MG/ML IJ SOLN
30.0000 mg | Freq: Once | INTRAMUSCULAR | Status: AC
  Administered 2023-01-12: 30 mg via INTRAVENOUS
  Filled 2023-01-12: qty 1

## 2023-01-12 MED ORDER — IBUPROFEN 600 MG PO TABS: 600.0000 mg | ORAL_TABLET | Freq: Four times a day (QID) | ORAL | 0 refills | Status: AC | PRN

## 2023-01-12 MED ORDER — ONDANSETRON 4 MG PO TBDP: 4.0000 mg | ORAL_TABLET | Freq: Three times a day (TID) | ORAL | 0 refills | Status: AC | PRN

## 2023-01-12 MED ORDER — LORAZEPAM 2 MG/ML IJ SOLN
0.5000 mg | Freq: Once | INTRAMUSCULAR | Status: AC
  Administered 2023-01-12: 0.5 mg via INTRAVENOUS
  Filled 2023-01-12: qty 1

## 2023-01-12 MED ORDER — HYDROCODONE-ACETAMINOPHEN 5-325 MG PO TABS: 1.0000 | ORAL_TABLET | ORAL | 0 refills | Status: AC | PRN

## 2023-01-12 NOTE — ED Provider Notes (Signed)
[facility] Provider Note   CSN: [id number] Arrival date & time: 01/12/23  2027     History  Chief Complaint  Patient presents with   Cough   Emesis    Emily [name] is a 37 y.o. female.  Pt is a 37 yo female with pmhx significant for gestational diabetes and htn.  She has been sick for 4-5 days with cough and runny nose.  She can't take care of her baby.  She has had vomiting and feels terrible.  Several family members have the flu and covid.  Pt was given zofran and ivfs by EMS en route.         Home Medications Prior to Admission medications   Medication Sig Start Date End Date Taking? Authorizing Provider  HYDROcodone-acetaminophen (NORCO/VICODIN) 5-325 MG tablet Take 1 tablet by mouth every 4 (four) hours as needed. 01/12/23  Yes Isla Pence, MD  ibuprofen (ADVIL) 600 MG tablet Take 1 tablet (600 mg total) by mouth every 6 (six) hours as needed. 01/12/23  Yes Isla Pence, MD  ondansetron (ZOFRAN-ODT) 4 MG disintegrating tablet Take 1 tablet (4 mg total) by mouth every 8 (eight) hours as needed for nausea or vomiting. 01/12/23  Yes Isla Pence, MD  escitalopram (LEXAPRO) 10 MG tablet TAKE 1 TABLET BY MOUTH EVERY DAY Patient not taking: Reported on 12/22/2022 12/06/22   Owens Loffler, DO  esomeprazole (NEXIUM) 40 MG capsule Take 1 capsule (40 mg total) by mouth daily. 11/13/22   Owens Loffler, DO  Incontinence Supplies MISC Use as needed. Dx stress incontinence 12/26/22   Hali Marry, MD  losartan (COZAAR) 100 MG tablet Take 1 tablet (100 mg total) by mouth daily. 12/22/22 03/22/23  Hali Marry, MD  Prenatal Vit-Fe Fumarate-FA (PREPLUS) 27-1 MG TABS Take 1 tablet by mouth daily. 01/30/22   Leftwich-Kirby, Kathie Dike, CNM      Allergies    Lisinopril    Review of Systems   Review of Systems  Respiratory:  Positive for cough and shortness of breath.   Gastrointestinal:  Positive for nausea and vomiting.   Neurological:  Positive for weakness.  All other systems reviewed and are negative.   Physical Exam Updated Vital Signs BP 113/70   Pulse 80   Temp 99.5 F (37.5 C) (Oral)   Resp (!) 22   Ht 5' (1.524 m)   Wt 63.5 kg   LMP 11/29/2022 (Approximate) Comment: lasted 4 days; heavy bleeding  SpO2 100%   BMI 27.34 kg/m  Physical Exam Vitals and nursing note reviewed.  Constitutional:      General: She is in acute distress.     Appearance: She is ill-appearing.  HENT:     Head: Normocephalic and atraumatic.     Right Ear: External ear normal.     Left Ear: External ear normal.     Nose: Nose normal.     Mouth/Throat:     Mouth: Mucous membranes are dry.  Eyes:     Extraocular Movements: Extraocular movements intact.     Conjunctiva/sclera: Conjunctivae normal.     Pupils: Pupils are equal, round, and reactive to light.  Cardiovascular:     Rate and Rhythm: Regular rhythm. Tachycardia present.     Pulses: Normal pulses.     Heart sounds: Normal heart sounds.  Pulmonary:     Effort: Pulmonary effort is normal.     Breath sounds: Normal breath sounds.  Abdominal:  General: Abdomen is flat. Bowel sounds are normal.     Palpations: Abdomen is soft.  Musculoskeletal:        General: Normal range of motion.     Cervical back: Normal range of motion and neck supple.  Skin:    General: Skin is warm.     Capillary Refill: Capillary refill takes less than 2 seconds.  Neurological:     General: No focal deficit present.     Mental Status: She is oriented to person, place, and time.  Psychiatric:        Mood and Affect: Mood is anxious.        Behavior: Behavior normal.     ED Results / Procedures / Treatments   Labs (all labs ordered are listed, but only abnormal results are displayed) Labs Reviewed  RESP PANEL BY RT-PCR (RSV, FLU A&B, COVID)  RVPGX2 - Abnormal; Notable for the following components:      Result Value   Influenza B by PCR POSITIVE (*)    All other  components within normal limits  CBC WITH DIFFERENTIAL/PLATELET - Abnormal; Notable for the following components:   MCHC 36.1 (*)    All other components within normal limits  COMPREHENSIVE METABOLIC PANEL - Abnormal; Notable for the following components:   Potassium 3.1 (*)    CO2 20 (*)    Glucose, Bld 116 (*)    Creatinine, Ser 1.07 (*)    Calcium 7.9 (*)    Total Protein 6.3 (*)    AST 73 (*)    ALT 78 (*)    All other components within normal limits  GROUP A STREP BY PCR  MAGNESIUM  I-STAT BETA HCG BLOOD, ED ([facility], [facility], [facility] ONLY)    EKG None  Radiology DG Chest Portable 1 View  Result Date: 01/12/2023 CLINICAL DATA:  Shortness of breath and cough EXAM: PORTABLE CHEST 1 VIEW COMPARISON:  None Available. FINDINGS: The heart size and mediastinal contours are within normal limits. Both lungs are clear. The visualized skeletal structures are unremarkable. IMPRESSION: No active disease. Electronically Signed   By: Inez Catalina M.D.   On: 01/12/2023 21:45    Procedures Procedures    Medications Ordered in ED Medications  sodium chloride 0.9 % bolus 1,000 mL (0 mLs Intravenous Stopped 01/12/23 2218)  ondansetron (ZOFRAN) injection 4 mg (4 mg Intravenous Given 01/12/23 2110)  ketorolac (TORADOL) 30 MG/ML injection 30 mg (30 mg Intravenous Given 01/12/23 2109)  LORazepam (ATIVAN) injection 0.5 mg (0.5 mg Intravenous Given 01/12/23 2109)  potassium chloride 10 mEq in 100 mL IVPB (10 mEq Intravenous New Bag/Given 01/12/23 2218)    ED Course/ Medical Decision Making/ A&P                             Medical Decision Making Amount and/or Complexity of Data Reviewed Labs: ordered. Radiology: ordered.  Risk Prescription drug management.   This patient presents to the ED for concern of cough, sob, this involves an extensive number of treatment options, and is a complaint that carries with it a high risk of complications and morbidity.  The differential diagnosis includes pna,  flu/covid, bronchitis   Co morbidities that complicate the patient evaluation  gestational diabetes and htn   Additional history obtained:  Additional history obtained from epic chart review External records from outside source obtained and reviewed including EMS report/husband   Lab Tests:  I Ordered, and personally interpreted labs.  The pertinent results include:  cbc nl, cmp with mild hypokalemia (k 3.1), mild aki (cr elevated at 1.07); preg neg; strep neg; flu b +   Imaging Studies ordered:  I ordered imaging studies including cxr  I independently visualized and interpreted imaging which showed No active disease.  I agree with the radiologist interpretation   Cardiac Monitoring:  The patient was maintained on a cardiac monitor.  I personally viewed and interpreted the cardiac monitored which showed an underlying rhythm of: st   Medicines ordered and prescription drug management:  I ordered medication including zofran/toradol/ativan  for sx  Reevaluation of the patient after these medicines showed that the patient improved I have reviewed the patients home medicines and have made adjustments as needed   Critical Interventions:  ivfs   Problem List / ED Course:  Influenza B:  pt is feeling much better.  She is stable for d/c.  Return if worse.  F/u with pcp.   Reevaluation:  After the interventions noted above, I reevaluated the patient and found that they have :improved   Social Determinants of Health:  Lives at home   Dispostion:  After consideration of the diagnostic results and the patients response to treatment, I feel that the patent would benefit from discharge with outpatient f/u.          Final Clinical Impression(s) / ED Diagnoses Final diagnoses:  Dehydration  Influenza B    Rx / DC Orders ED Discharge Orders          Ordered    ondansetron (ZOFRAN-ODT) 4 MG disintegrating tablet  Every 8 hours PRN        01/12/23 2235     ibuprofen (ADVIL) 600 MG tablet  Every 6 hours PRN        01/12/23 2235    HYDROcodone-acetaminophen (NORCO/VICODIN) 5-325 MG tablet  Every 4 hours PRN        01/12/23 2235              Jacalyn Lefevre, MD 01/12/23 2328

## 2023-01-12 NOTE — ED Triage Notes (Signed)
Pt arrives to ED c/o flu-like symptoms, n/v, cough x several days. Pt reports that several family members have the flu. Pt dry heaving upon arrival. Pt given 4mg  of zofran and 1L of NS in route

## 2023-01-15 ENCOUNTER — Telehealth: Payer: Self-pay | Admitting: General Practice

## 2023-01-15 NOTE — Telephone Encounter (Signed)
Transition Care Management Unsuccessful Follow-up Telephone Call  Date of discharge and from where:  01/12/23 from [facility]  Attempts:  1st Attempt  Reason for unsuccessful TCM follow-up call:  Left voice message

## 2023-01-16 ENCOUNTER — Ambulatory Visit: Payer: Medicaid Other | Admitting: Physician Assistant

## 2023-01-18 NOTE — Telephone Encounter (Signed)
Transition Care Management Unsuccessful Follow-up Telephone Call  Date of discharge and from where:  01/12/23 from [facility]   Attempts:  2nd Attempt  Reason for unsuccessful TCM follow-up call:  Left voice message

## 2023-01-19 NOTE — Telephone Encounter (Signed)
Transition Care Management Unsuccessful Follow-up Telephone Call  Date of discharge and from where:  01/12/23 from [facility]  Attempts:  3rd Attempt  Reason for unsuccessful TCM follow-up call:  Left voice message

## 2023-01-22 ENCOUNTER — Ambulatory Visit: Payer: Medicaid Other | Admitting: Physician Assistant

## 2023-01-24 ENCOUNTER — Ambulatory Visit: Payer: Medicaid Other | Admitting: Physician Assistant

## 2023-02-08 ENCOUNTER — Other Ambulatory Visit: Payer: Self-pay | Admitting: Family Medicine

## 2023-02-08 DIAGNOSIS — I10 Essential (primary) hypertension: Secondary | ICD-10-CM

## 2023-02-09 ENCOUNTER — Telehealth: Payer: Self-pay | Admitting: Family Medicine

## 2023-02-09 NOTE — Telephone Encounter (Signed)
According to her medical record, she was prescribed losartan 100 mg daily.  This prescription was sent in on December 22, 2022 with 90-day supply and 1 refill.  If this was picked up in January, she should still have the medication from the initial fill +1 refill.  Unclear why 25 mg was sent to the pharmacy.  She should be taking 100 mg daily.  See below.   losartan (COZAAR) 100 MG tablet 100 mg, Daily         Summary: Take 1 tablet (100 mg total) by mouth daily., Starting Fri 12/22/2022, Until Thu 03/22/2023, Normal Dose, Route, Frequency: 100 mg, Oral, DailyStart: 01/05/2024End: 04/04/2024Ord/Sold: 12/22/2022 (O)Ordered On: 01/05/2024[id number] [place] [place] [address]: Taking: Long-term: Med Note:        Change Patient Sig: Take 1 tablet (100 mg total) by mouth daily. Authorized By: MHali Marry MD Dispense: 90 tablet Refills: 1 ordered Dose History    ___________________________________________ JClearnce Sorrel DNP, APRN, FNP-BC [facility] [place]

## 2023-02-09 NOTE — Telephone Encounter (Signed)
Pt called about their losartan (COZAAR) 25 MG tablet [id number] prescription. Pt stated they were given 25 mg instead of 100 mg. Pharmacy told pt to contact us.

## 2023-02-15 ENCOUNTER — Other Ambulatory Visit: Payer: Self-pay | Admitting: Family Medicine

## 2023-02-16 NOTE — Telephone Encounter (Signed)
Spoke to pharmacist at [facility]- states patient did pick up  the Losartan '100mg'$   as 90 day supply at [facility] [address] on 12/22/2022. She also picked up Losartan '25mg'$   on Feb 09, 2023. Patient checked her medications and states she does have the '100mg'$  tablet patient instructed to take this medication  strength and not the '25mg'$ . Will call [facility] [address] and cancel the '25mg'$  tablets refills.

## 2023-02-21 ENCOUNTER — Telehealth: Payer: Self-pay | Admitting: Family Medicine

## 2023-02-21 NOTE — Telephone Encounter (Signed)
Please submit to [facility] [place] [address] [phone]

## 2023-02-21 NOTE — Telephone Encounter (Signed)
Patient is requesting a new blood pressure monitor

## 2023-02-22 MED ORDER — BLOOD PRESSURE CUFF MISC: 0 refills | Status: DC

## 2023-02-22 MED ORDER — BLOOD PRESSURE CUFF MISC: 0 refills | Status: AC

## 2023-02-26 ENCOUNTER — Ambulatory Visit: Payer: Medicaid Other | Admitting: Family Medicine

## 2023-02-26 ENCOUNTER — Encounter: Payer: Self-pay | Admitting: Family Medicine

## 2023-02-26 VITALS — BP 139/87 | HR 90 | Ht 60.0 in | Wt 158.1 lb

## 2023-02-26 DIAGNOSIS — I10 Essential (primary) hypertension: Secondary | ICD-10-CM

## 2023-02-26 NOTE — Assessment & Plan Note (Signed)
-   BP well controlled on losartan 100mg , will continue - will get blood work at next visit. Pt's son who had covid was here in office today after getting diagnosed by urgent care. Told mom to go home and rest and reach out if she needs anything. Pt verbalized understanding.

## 2023-02-26 NOTE — Progress Notes (Signed)
Established patient visit   Patient: Emily [name]   DOB: December 14, 1986   37 y.o. Female  MRN: [id number] Visit Date: 02/26/2023  Today's healthcare provider: Owens Loffler, DO   Chief Complaint  Patient presents with   Follow-up   Blood Pressure Check    SUBJECTIVE    Chief Complaint  Patient presents with   Follow-up   Blood Pressure Check   HPI  Pt presents for HTN follow up. She is currently on '100mg'$  of losartan. BP controlled at 139/87 today. She says it is higher than usual today. Does have a son that is sick with COVID and another son who is sick. She denies chest pain, shortness of breath.   Review of Systems  Constitutional:  Negative for activity change, fatigue and fever.  Respiratory:  Negative for cough and shortness of breath.   Cardiovascular:  Negative for chest pain.  Gastrointestinal:  Negative for abdominal pain.  Genitourinary:  Negative for difficulty urinating.      Current Meds  Medication Sig   Blood Pressure Monitoring (BLOOD PRESSURE CUFF) MISC Use to check blood pressure daily for Hypertension.   escitalopram (LEXAPRO) 10 MG tablet TAKE 1 TABLET BY MOUTH EVERY DAY   esomeprazole (NEXIUM) 40 MG capsule Take 1 capsule (40 mg total) by mouth daily.   HYDROcodone-acetaminophen (NORCO/VICODIN) 5-325 MG tablet Take 1 tablet by mouth every 4 (four) hours as needed.   ibuprofen (ADVIL) 600 MG tablet Take 1 tablet (600 mg total) by mouth every 6 (six) hours as needed.   Incontinence Supplies MISC Use as needed. Dx stress incontinence   ondansetron (ZOFRAN-ODT) 4 MG disintegrating tablet Take 1 tablet (4 mg total) by mouth every 8 (eight) hours as needed for nausea or vomiting.   Prenatal Vit-Fe Fumarate-FA (PREPLUS) 27-1 MG TABS Take 1 tablet by mouth daily.   [DISCONTINUED] losartan (COZAAR) 50 MG tablet TAKE 1 TABLET BY MOUTH EVERY DAY    OBJECTIVE    BP 139/87 (BP Location: Left Arm, Patient Position: Sitting, Cuff Size: Normal)   Pulse 90   Ht  5' (1.524 m)   Wt 158 lb 1.3 oz (71.7 kg)   SpO2 98%   BMI 30.87 kg/m   Physical Exam Vitals and nursing note reviewed.  Constitutional:      General: She is not in acute distress.    Appearance: Normal appearance.  HENT:     Head: Normocephalic and atraumatic.     Right Ear: External ear normal.     Left Ear: External ear normal.     Nose: Nose normal.  Eyes:     Conjunctiva/sclera: Conjunctivae normal.  Cardiovascular:     Rate and Rhythm: Normal rate and regular rhythm.  Pulmonary:     Effort: Pulmonary effort is normal.     Breath sounds: Normal breath sounds.  Neurological:     General: No focal deficit present.     Mental Status: She is alert and oriented to person, place, and time.  Psychiatric:        Mood and Affect: Mood normal.        Behavior: Behavior normal.        Thought Content: Thought content normal.        Judgment: Judgment normal.        ASSESSMENT & PLAN    Problem List Items Addressed This Visit       Cardiovascular and Mediastinum   Primary hypertension - Primary    -  BP well controlled on losartan '100mg'$ , will continue - will get blood work at next visit. Pt's son who had covid was here in office today after getting diagnosed by urgent care. Told mom to go home and rest and reach out if she needs anything. Pt verbalized understanding.        Return in about 3 months (around 05/29/2023).      No orders of the defined types were placed in this encounter.   No orders of the defined types were placed in this encounter.    Owens Loffler, DO  [place] at [facility] [phone] (phone) [phone] (fax)  [place]

## 2023-03-07 NOTE — Progress Notes (Unsigned)
     Established patient visit   Patient: Emily [name]   DOB: 18-Nov-1986   37 y.o. Female  MRN: [id number] Visit Date: 03/08/2023  Today's healthcare provider: Owens Loffler, DO   No chief complaint on file.   SUBJECTIVE   No chief complaint on file.  HPI    Review of Systems     No outpatient medications have been marked as taking for the 03/08/23 encounter (Appointment) with Owens Loffler, DO.    OBJECTIVE    There were no vitals taken for this visit.  Physical Exam   {Show previous labs (optional):23736}    ASSESSMENT & PLAN    Problem List Items Addressed This Visit   None   No follow-ups on file.      No orders of the defined types were placed in this encounter.   No orders of the defined types were placed in this encounter.    Owens Loffler, DO  [place] at [facility] [phone] (phone) [phone] (fax)  [place]

## 2023-03-08 ENCOUNTER — Encounter: Payer: Self-pay | Admitting: Family Medicine

## 2023-03-08 ENCOUNTER — Ambulatory Visit: Payer: Medicaid Other | Admitting: Family Medicine

## 2023-03-08 ENCOUNTER — Ambulatory Visit: Payer: Medicaid Other | Attending: Family Medicine

## 2023-03-08 VITALS — BP 127/80 | HR 86 | Resp 20 | Ht 60.0 in | Wt 157.1 lb

## 2023-03-08 DIAGNOSIS — R002 Palpitations: Secondary | ICD-10-CM | POA: Diagnosis not present

## 2023-03-08 DIAGNOSIS — F410 Panic disorder [episodic paroxysmal anxiety] without agoraphobia: Secondary | ICD-10-CM

## 2023-03-08 MED ORDER — BUSPIRONE HCL 5 MG PO TABS: 5.0000 mg | ORAL_TABLET | Freq: Two times a day (BID) | ORAL | 0 refills | Status: AC

## 2023-03-08 NOTE — Patient Instructions (Signed)
Take 5mg  of lexapro for 10 days  Then take 5mg  every other day for 10 days and stop   Take buspar 5mg  when you feel a panic attack

## 2023-03-08 NOTE — Assessment & Plan Note (Signed)
Patient is still having panic attacks.  Will give her BuSpar 5 mg daily. I am hesitant to keep trying different SSRIs since she feels like Lexapro did not work and Zoloft does not work.  With her mentioning her son has ADHD as does her brother in addition to her trouble focusing.  I am wondering if maybe she has ADHD and we are not adequately treating this which is why her anxiety is not being controlled. - I have given her a taper for Lexapro to do 5 mg for 10 days and then 5 mg every other day and then stop.

## 2023-03-08 NOTE — Assessment & Plan Note (Signed)
-   Patient does note some palpitations but is not sure if that is the beginning of a panic attack.  Heart is regular rate and rhythm on exam today however, with her feeling these palpitations I feel it would be best to do a Zio patch monitor.

## 2023-03-08 NOTE — Progress Notes (Unsigned)
Enrolled patient for a 3 day Zio XT monitor to be mailed to patients home   DOD to read 

## 2023-05-16 ENCOUNTER — Telehealth: Payer: Self-pay | Admitting: Family Medicine

## 2023-05-16 NOTE — Telephone Encounter (Signed)
Pt called. Pt states she is not comfortable being seen by a female for her therapy sessions and she would like to be referred to another office to see a female. This office did not have a female therapist.

## 2023-05-17 NOTE — Telephone Encounter (Signed)
Referral, clinical notes and copies of insurance cards have been faxed to [facility] at [phone]. Office will contact patient to schedule referral appointment.  Patient has [place] [employer] and this limits were referral can be sent.

## 2023-05-27 ENCOUNTER — Encounter: Payer: Self-pay | Admitting: Emergency Medicine

## 2023-05-27 ENCOUNTER — Ambulatory Visit
Admission: EM | Admit: 2023-05-27 | Discharge: 2023-05-27 | Disposition: A | Discharge status: Home / Self Care | Payer: Medicaid Other | Attending: Physician Assistant | Admitting: Physician Assistant

## 2023-05-27 DIAGNOSIS — J029 Acute pharyngitis, unspecified: Secondary | ICD-10-CM

## 2023-05-27 DIAGNOSIS — Z20818 Contact with and (suspected) exposure to other bacterial communicable diseases: Secondary | ICD-10-CM | POA: Diagnosis not present

## 2023-05-27 LAB — POCT RAPID STREP A (OFFICE): Rapid Strep A Screen: NEGATIVE

## 2023-05-27 NOTE — Discharge Instructions (Signed)
Your strep was negative.  I recommend gargling with warm salt water and alternate medication such as Tylenol and ibuprofen.  We will send this for culture and contact you if we need to start any antibiotics.  If you have any worsening or changing symptoms including fever, abdominal pain, worsening sore throat, difficulty swallowing, swelling of your throat, shortness of breath, change in your voice you should be seen immediately.

## 2023-05-27 NOTE — ED Triage Notes (Signed)
Sore throat this am  Children have tested positive for strep today

## 2023-05-27 NOTE — ED Provider Notes (Signed)
[facility]    CSN: [id number] Arrival date & time: 05/27/23  1231      History   Chief Complaint Chief Complaint  Patient presents with   Sore Throat    HPI Emily [name] is a 37 y.o. female.   Patient presents today with a 1 day history of sore throat.  She denies any additional symptoms including fever, cough, congestion, nausea, vomiting, diarrhea.  Reports that sore throat pain is mild and rated 2 on a 0-10 pain scale, described as scratchy, no aggravating leaving factors identified.  Does report that several of her children have tested positive for strep pharyngitis.  Denies additional known sick contacts.  She has not tried any over-the-counter medication for symptom management.  Denies history of asthma or allergies.  She is eating and drinking normally.    Past Medical History:  Diagnosis Date   Gestational diabetes    History of abnormal cervical Pap smear 08/21/2022   Hx some type of procedure for abnormal Pap in the past (some type of excision). Records request but not received.   History of gestational hypertension 08/21/2022   Hypertension     Patient Active Problem List   Diagnosis Date Noted   Panic attacks 03/08/2023   Palpitations 03/08/2023   Primary hypertension 11/13/2022   Stress incontinence of urine 11/13/2022   Hair loss 11/13/2022   Generalized anxiety disorder 11/13/2022   Gastroesophageal reflux disease without esophagitis 11/13/2022   History of gestational hypertension 08/21/2022   History of abnormal cervical Pap smear 08/21/2022   History of gestational diabetes 04/03/2022   Family history of cleft palate 01/22/2022   S/P bilateral breast reduction 11/29/2021    Past Surgical History:  Procedure Laterality Date   BREAST REDUCTION SURGERY  08/04/2020   CHOLECYSTECTOMY     TUBAL LIGATION N/A 06/01/2022   Procedure: POST PARTUM TUBAL LIGATION;  Surgeon: Myna Hidalgo, DO;  Location: [facility];  Service: Gynecology;   Laterality: N/A;    OB History     Gravida  3   Para  3   Term  3   Preterm      AB      Living  3      SAB      IAB      Ectopic      Multiple  0   Live Births  3            Home Medications    Prior to Admission medications   Medication Sig Start Date End Date Taking? Authorizing Provider  FLUoxetine (PROZAC) 10 MG capsule Take 10 mg by mouth daily. 04/24/23  Yes [provider]  losartan (COZAAR) 100 MG tablet Take 100 mg by mouth daily. 03/19/23  Yes [provider]  Blood Pressure Monitoring (BLOOD PRESSURE CUFF) MISC Use to check blood pressure daily for Hypertension. 02/22/23   Everrett Coombe, DO  busPIRone (BUSPAR) 5 MG tablet Take 1 tablet (5 mg total) by mouth 2 (two) times daily. 03/08/23   Charlton Amor, DO  escitalopram (LEXAPRO) 10 MG tablet TAKE 1 TABLET BY MOUTH EVERY DAY Patient not taking: Reported on 05/27/2023 12/06/22   Charlton Amor, DO  esomeprazole (NEXIUM) 40 MG capsule Take 1 capsule (40 mg total) by mouth daily. 11/13/22   Charlton Amor, DO  HYDROcodone-acetaminophen (NORCO/VICODIN) 5-325 MG tablet Take 1 tablet by mouth every 4 (four) hours as needed. Patient not taking: Reported on 05/27/2023 01/12/23   Particia Nearing,  Raynelle Fanning, MD  ibuprofen (ADVIL) 600 MG tablet Take 1 tablet (600 mg total) by mouth every 6 (six) hours as needed. Patient not taking: Reported on 05/27/2023 01/12/23   Jacalyn Lefevre, MD  Incontinence Supplies MISC Use as needed. Dx stress incontinence 12/26/22   Agapito Games, MD  ondansetron (ZOFRAN-ODT) 4 MG disintegrating tablet Take 1 tablet (4 mg total) by mouth every 8 (eight) hours as needed for nausea or vomiting. 01/12/23   Jacalyn Lefevre, MD  Prenatal Vit-Fe Fumarate-FA (PREPLUS) 27-1 MG TABS Take 1 tablet by mouth daily. 01/30/22   Leftwich-Kirby, Wilmer Floor, CNM    Family History Family History  Problem Relation Age of Onset   Hypertension Mother    Diabetes Mother    Stroke Father    Kidney disease  Father    Hypertension Father    Diabetes Father    Cancer Maternal Grandmother    Diabetes Maternal Grandmother    Diabetes Paternal Grandmother    Heart disease Neg Hx     Social History Social History   Tobacco Use   Smoking status: Never   Smokeless tobacco: Never  Vaping Use   Vaping Use: Never used  Substance Use Topics   Alcohol use: Yes    Comment: occ   Drug use: Never     Allergies   Lisinopril   Review of Systems Review of Systems  Constitutional:  Negative for activity change, appetite change, fatigue and fever.  HENT:  Positive for sore throat. Negative for congestion, sinus pressure and sneezing.   Respiratory:  Negative for cough and shortness of breath.   Cardiovascular:  Negative for chest pain.  Gastrointestinal:  Negative for abdominal pain, diarrhea, nausea and vomiting.  Neurological:  Negative for dizziness, light-headedness and headaches.     Physical Exam Triage Vital Signs ED Triage Vitals  Enc Vitals Group     BP 05/27/23 1240 127/89     Pulse Rate 05/27/23 1240 87     Resp 05/27/23 1240 14     Temp 05/27/23 1240 99.1 F (37.3 C)     Temp Source 05/27/23 1240 Oral     SpO2 05/27/23 1240 96 %     Weight 05/27/23 1242 150 lb (68 kg)     Height --      Head Circumference --      Peak Flow --      Pain Score 05/27/23 1241 2     Pain Loc --      Pain Edu? --      Excl. in GC? --    No data found.  Updated Vital Signs BP 127/89 (BP Location: Left Arm)   Pulse 87   Temp 99.1 F (37.3 C) (Oral)   Resp 14   Wt 150 lb (68 kg)   LMP 05/24/2023 (Exact Date)   SpO2 96%   BMI 29.29 kg/m   Visual Acuity Right Eye Distance:   Left Eye Distance:   Bilateral Distance:    Right Eye Near:   Left Eye Near:    Bilateral Near:     Physical Exam Vitals reviewed.  Constitutional:      General: She is awake. She is not in acute distress.    Appearance: Normal appearance. She is well-developed. She is not ill-appearing.      Comments: Very pleasant female appears stated age in no acute distress sitting comfortably in exam room  HENT:     Head: Normocephalic and atraumatic.     Right  Ear: Tympanic membrane, ear canal and external ear normal. Tympanic membrane is not erythematous or bulging.     Left Ear: Tympanic membrane, ear canal and external ear normal. Tympanic membrane is not erythematous or bulging.     Nose:     Right Sinus: No maxillary sinus tenderness or frontal sinus tenderness.     Left Sinus: No maxillary sinus tenderness or frontal sinus tenderness.     Mouth/Throat:     Pharynx: Uvula midline. Posterior oropharyngeal erythema present. No oropharyngeal exudate.     Tonsils: No tonsillar exudate.     Comments: Mild erythema present posterior oropharynx with associated drainage Cardiovascular:     Rate and Rhythm: Normal rate and regular rhythm.     Heart sounds: Normal heart sounds, S1 normal and S2 normal. No murmur heard. Pulmonary:     Effort: Pulmonary effort is normal.     Breath sounds: Normal breath sounds. No wheezing, rhonchi or rales.     Comments: Clear to auscultation bilaterally Psychiatric:        Behavior: Behavior is cooperative.      UC Treatments / Results  Labs (all labs ordered are listed, but only abnormal results are displayed) Labs Reviewed  CULTURE, GROUP A STREP [facility])  POCT RAPID STREP A (OFFICE)    EKG   Radiology No results found.  Procedures Procedures (including critical care time)  Medications Ordered in UC Medications - No data to display  Initial Impression / Assessment and Plan / UC Course  I have reviewed the triage vital signs and the nursing notes.  Pertinent labs & imaging results that were available during my care of the patient were reviewed by me and considered in my medical decision making (see chart for details).     Patient is well-appearing, afebrile, nontoxic, nontachycardic.  Strep testing was negative.  Will send this for  culture but defer antibiotics for the time being given minimal sore throat and no additional symptoms.  Discussed that if she has any worsening symptoms she should return for reevaluation.  Can gargle with warm salt water and use Tylenol ibuprofen for pain.  Discussed that if she has any severe symptoms including high fever, swelling of her throat, shortness of breath, muffled voice, dysphagia, nausea, vomiting she should be seen immediately.  Strict return precautions given.  Final Clinical Impressions(s) / UC Diagnoses   Final diagnoses:  Sore throat  Exposure to strep throat     Discharge Instructions      Your strep was negative.  I recommend gargling with warm salt water and alternate medication such as Tylenol and ibuprofen.  We will send this for culture and contact you if we need to start any antibiotics.  If you have any worsening or changing symptoms including fever, abdominal pain, worsening sore throat, difficulty swallowing, swelling of your throat, shortness of breath, change in your voice you should be seen immediately.    ED Prescriptions   None    PDMP not reviewed this encounter.   Jeani Hawking, PA-C 05/27/23 1254

## 2023-05-28 LAB — CULTURE, GROUP A STREP (THRC)

## 2023-05-29 ENCOUNTER — Ambulatory Visit: Payer: Medicaid Other | Admitting: Family Medicine

## 2023-05-29 LAB — CULTURE, GROUP A STREP (THRC)

## 2023-06-04 ENCOUNTER — Ambulatory Visit: Payer: Medicaid Other | Admitting: Family Medicine

## 2023-06-04 NOTE — Progress Notes (Deleted)
     Established patient visit   Patient: Emily [name]   DOB: 25-Dec-1985   37 y.o. Female  MRN: [id number] Visit Date: 06/04/2023  Today's healthcare provider: Charlton Amor, DO   No chief complaint on file.   SUBJECTIVE   No chief complaint on file.  HPI   Pt presents for follow up on panic attacks.   Review of Systems     No outpatient medications have been marked as taking for the 06/04/23 encounter (Appointment) with Charlton Amor, DO.    OBJECTIVE    LMP 05/24/2023 (Exact Date)   Physical Exam       ASSESSMENT & PLAN    Problem List Items Addressed This Visit   None   No follow-ups on file.      No orders of the defined types were placed in this encounter.   No orders of the defined types were placed in this encounter.    Charlton Amor, DO  [facility] at [facility] [phone] (phone) [phone] (fax)  [facility]

## 2023-06-07 ENCOUNTER — Ambulatory Visit
Admission: EM | Admit: 2023-06-07 | Discharge: 2023-06-07 | Disposition: A | Discharge status: Home / Self Care | Payer: Medicaid Other | Attending: Family Medicine | Admitting: Family Medicine

## 2023-06-07 DIAGNOSIS — R509 Fever, unspecified: Secondary | ICD-10-CM

## 2023-06-07 DIAGNOSIS — J020 Streptococcal pharyngitis: Secondary | ICD-10-CM | POA: Diagnosis not present

## 2023-06-07 LAB — POCT RAPID STREP A (OFFICE): Rapid Strep A Screen: POSITIVE — AB

## 2023-06-07 MED ORDER — PREDNISONE 20 MG PO TABS: ORAL_TABLET | ORAL | 0 refills | Status: AC

## 2023-06-07 MED ORDER — AMOXICILLIN-POT CLAVULANATE 875-125 MG PO TABS: 1.0000 | ORAL_TABLET | Freq: Two times a day (BID) | ORAL | 0 refills | Status: AC

## 2023-06-07 MED ORDER — ACETAMINOPHEN 500 MG PO TABS
1000.0000 mg | ORAL_TABLET | Freq: Once | ORAL | Status: AC
  Administered 2023-06-07: 1000 mg via ORAL

## 2023-06-07 NOTE — Discharge Instructions (Addendum)
Advised patient to take medication as directed with food to completion.  Advised patient to take prednisone with first dose of Augmentin for the next 5 of 10 days.  Advised may take OTC Tylenol 1000 mg every 6 hours for fever (oral temperature greater than 100.3).  Encouraged increase daily water intake to 64 ounces per day while taking these medications.  Advised if symptoms worsen and/or unresolved please follow-up with PCP or here for further evaluation.

## 2023-06-07 NOTE — ED Provider Notes (Signed)
[facility]    CSN: [id number] Arrival date & time: 06/07/23  1855      History   Chief Complaint Chief Complaint  Patient presents with   Generalized Body Aches   URI    HPI Emily [name] is a 37 y.o. female.   HPI 37 year old female presents with fever, cold chills, cough, sore throat, congestion, headache, nausea since 4 PM today.  Patient is accompanied with her 2 children who will be also evaluating this evening.  PMH significant for obesity, HTN, and GERD.  Past Medical History:  Diagnosis Date   Gestational diabetes    History of abnormal cervical Pap smear 08/21/2022   Hx some type of procedure for abnormal Pap in the past (some type of excision). Records request but not received.   History of gestational hypertension 08/21/2022   Hypertension     Patient Active Problem List   Diagnosis Date Noted   Panic attacks 03/08/2023   Palpitations 03/08/2023   Primary hypertension 11/13/2022   Stress incontinence of urine 11/13/2022   Hair loss 11/13/2022   Generalized anxiety disorder 11/13/2022   Gastroesophageal reflux disease without esophagitis 11/13/2022   History of gestational hypertension 08/21/2022   History of abnormal cervical Pap smear 08/21/2022   History of gestational diabetes 04/03/2022   Family history of cleft palate 01/22/2022   S/P bilateral breast reduction 11/29/2021    Past Surgical History:  Procedure Laterality Date   BREAST REDUCTION SURGERY  08/04/2020   CHOLECYSTECTOMY     TUBAL LIGATION N/A 06/01/2022   Procedure: POST PARTUM TUBAL LIGATION;  Surgeon: Myna Hidalgo, DO;  Location: [facility] LD ORS;  Service: Gynecology;  Laterality: N/A;    OB History     Gravida  3   Para  3   Term  3   Preterm      AB      Living  3      SAB      IAB      Ectopic      Multiple  0   Live Births  3            Home Medications    Prior to Admission medications   Medication Sig Start Date End Date Taking?  Authorizing Provider  amoxicillin-clavulanate (AUGMENTIN) 875-125 MG tablet Take 1 tablet by mouth 2 (two) times daily for 10 days. 06/07/23 06/17/23 Yes Trevor Iha, FNP  predniSONE (DELTASONE) 20 MG tablet Take 3 tabs PO daily x 5 days. 06/07/23  Yes Trevor Iha, FNP  Blood Pressure Monitoring (BLOOD PRESSURE CUFF) MISC Use to check blood pressure daily for Hypertension. 02/22/23   Everrett Coombe, DO  busPIRone (BUSPAR) 5 MG tablet Take 1 tablet (5 mg total) by mouth 2 (two) times daily. 03/08/23   Charlton Amor, DO  escitalopram (LEXAPRO) 10 MG tablet TAKE 1 TABLET BY MOUTH EVERY DAY Patient not taking: Reported on 05/27/2023 12/06/22   Charlton Amor, DO  esomeprazole (NEXIUM) 40 MG capsule Take 1 capsule (40 mg total) by mouth daily. 11/13/22   Charlton Amor, DO  FLUoxetine (PROZAC) 10 MG capsule Take 10 mg by mouth daily. 04/24/23   [provider]  HYDROcodone-acetaminophen (NORCO/VICODIN) 5-325 MG tablet Take 1 tablet by mouth every 4 (four) hours as needed. Patient not taking: Reported on 05/27/2023 01/12/23   Jacalyn Lefevre, MD  ibuprofen (ADVIL) 600 MG tablet Take 1 tablet (600 mg total) by mouth every 6 (six) hours as needed. Patient not  taking: Reported on 05/27/2023 01/12/23   Jacalyn Lefevre, MD  Incontinence Supplies MISC Use as needed. Dx stress incontinence 12/26/22   Agapito Games, MD  losartan (COZAAR) 100 MG tablet Take 100 mg by mouth daily. 03/19/23   [provider]  ondansetron (ZOFRAN-ODT) 4 MG disintegrating tablet Take 1 tablet (4 mg total) by mouth every 8 (eight) hours as needed for nausea or vomiting. 01/12/23   Jacalyn Lefevre, MD  Prenatal Vit-Fe Fumarate-FA (PREPLUS) 27-1 MG TABS Take 1 tablet by mouth daily. 01/30/22   Leftwich-Kirby, Wilmer Floor, CNM    Family History Family History  Problem Relation Age of Onset   Hypertension Mother    Diabetes Mother    Stroke Father    Kidney disease Father    Hypertension Father    Diabetes Father    Cancer  Maternal Grandmother    Diabetes Maternal Grandmother    Diabetes Paternal Grandmother    Heart disease Neg Hx     Social History Social History   Tobacco Use   Smoking status: Never   Smokeless tobacco: Never  Vaping Use   Vaping Use: Never used  Substance Use Topics   Alcohol use: Yes    Comment: occ   Drug use: Never     Allergies   Lisinopril   Review of Systems Review of Systems  Constitutional:  Positive for chills.  HENT:  Positive for congestion and sore throat.   Respiratory:  Positive for cough.   Gastrointestinal:  Positive for nausea.  All other systems reviewed and are negative.    Physical Exam Triage Vital Signs ED Triage Vitals  Enc Vitals Group     BP      Pulse      Resp      Temp      Temp src      SpO2      Weight      Height      Head Circumference      Peak Flow      Pain Score      Pain Loc      Pain Edu?      Excl. in GC?    No data found.  Updated Vital Signs BP 111/78   Pulse 64   Temp 99.7 F (37.6 C)   Resp 16   LMP 05/24/2023 (Exact Date)   SpO2 98%    Physical Exam Vitals and nursing note reviewed.  Constitutional:      Appearance: Normal appearance. She is obese. She is ill-appearing.  HENT:     Head: Normocephalic and atraumatic.     Right Ear: Tympanic membrane, ear canal and external ear normal.     Left Ear: Tympanic membrane, ear canal and external ear normal.     Mouth/Throat:     Mouth: Mucous membranes are moist.     Pharynx: Oropharynx is clear. Uvula midline. Posterior oropharyngeal erythema and uvula swelling present.     Tonsils: 3+ on the right. 3+ on the left.  Eyes:     Extraocular Movements: Extraocular movements intact.     Conjunctiva/sclera: Conjunctivae normal.     Pupils: Pupils are equal, round, and reactive to light.  Cardiovascular:     Rate and Rhythm: Normal rate and regular rhythm.     Pulses: Normal pulses.     Heart sounds: Normal heart sounds.  Pulmonary:     Effort:  Pulmonary effort is normal.     Breath sounds: Normal  breath sounds. No wheezing, rhonchi or rales.  Musculoskeletal:        General: Normal range of motion.     Cervical back: Normal range of motion and neck supple. Tenderness present.  Lymphadenopathy:     Cervical: Cervical adenopathy present.  Skin:    General: Skin is warm and dry.  Neurological:     General: No focal deficit present.     Mental Status: She is alert and oriented to person, place, and time. Mental status is at baseline.  Psychiatric:        Mood and Affect: Mood normal.        Behavior: Behavior normal.      UC Treatments / Results  Labs (all labs ordered are listed, but only abnormal results are displayed) Labs Reviewed  POCT RAPID STREP A (OFFICE) - Abnormal; Notable for the following components:      Result Value   Rapid Strep A Screen Positive (*)    All other components within normal limits    EKG   Radiology No results found.  Procedures Procedures (including critical care time)  Medications Ordered in UC Medications  acetaminophen (TYLENOL) tablet 1,000 mg (1,000 mg Oral Given 06/07/23 1924)    Initial Impression / Assessment and Plan / UC Course  I have reviewed the triage vital signs and the nursing notes.  Pertinent labs & imaging results that were available during my care of the patient were reviewed by me and considered in my medical decision making (see chart for details).     MDM: 1.  Strep pharyngitis-Rx'd Augmentin 875/125 mg tablet twice daily x 10 days, Rx'd Prednisone 60 mg daily x 5 days; 2.  Fever-Tylenol 1000 mg given once in clinic and prior to discharge. Advised may take OTC Tylenol 1000 mg every 6 hours for fever (oral temperature greater than 100.3). Advised patient to take medication as directed with food to completion.  Advised patient to take prednisone with first dose of Augmentin for the next 5 of 10 days. Encouraged increase daily water intake to 64 ounces per day  while taking these medications.  Advised if symptoms worsen and/or unresolved please follow-up with PCP or here for further evaluation.  Patient discharged home, hemodynamically stable. Final Clinical Impressions(s) / UC Diagnoses   Final diagnoses:  Strep pharyngitis  Fever, unspecified     Discharge Instructions      Advised patient to take medication as directed with food to completion.  Advised patient to take prednisone with first dose of Augmentin for the next 5 of 10 days.  Advised may take OTC Tylenol 1000 mg every 6 hours for fever (oral temperature greater than 100.3).  Encouraged increase daily water intake to 64 ounces per day while taking these medications.  Advised if symptoms worsen and/or unresolved please follow-up with PCP or here for further evaluation.     ED Prescriptions     Medication Sig Dispense Auth. Provider   amoxicillin-clavulanate (AUGMENTIN) 875-125 MG tablet Take 1 tablet by mouth 2 (two) times daily for 10 days. 20 tablet Trevor Iha, FNP   predniSONE (DELTASONE) 20 MG tablet Take 3 tabs PO daily x 5 days. 15 tablet Trevor Iha, FNP      PDMP not reviewed this encounter.   Trevor Iha, FNP 06/07/23 1951

## 2023-06-07 NOTE — ED Triage Notes (Signed)
Pt presents to uc with co of cold chills, cough, sore throat. congestion, ha, nausea since 4 pm today. Pt reports no medications.

## 2023-06-09 ENCOUNTER — Other Ambulatory Visit: Payer: Self-pay | Admitting: Family Medicine

## 2023-06-14 ENCOUNTER — Ambulatory Visit (INDEPENDENT_AMBULATORY_CARE_PROVIDER_SITE_OTHER): Payer: Medicaid Other | Admitting: Family Medicine

## 2023-06-14 DIAGNOSIS — Z91199 Patient's noncompliance with other medical treatment and regimen due to unspecified reason: Secondary | ICD-10-CM

## 2023-06-14 NOTE — Progress Notes (Signed)
No show

## 2023-06-19 ENCOUNTER — Ambulatory Visit (INDEPENDENT_AMBULATORY_CARE_PROVIDER_SITE_OTHER): Payer: Medicaid Other | Admitting: Family Medicine

## 2023-06-19 DIAGNOSIS — Z91199 Patient's noncompliance with other medical treatment and regimen due to unspecified reason: Secondary | ICD-10-CM

## 2023-06-19 NOTE — Progress Notes (Signed)
No show

## 2023-07-02 ENCOUNTER — Ambulatory Visit (INDEPENDENT_AMBULATORY_CARE_PROVIDER_SITE_OTHER): Payer: Medicaid Other

## 2023-07-02 ENCOUNTER — Ambulatory Visit: Payer: Medicaid Other | Admitting: Family Medicine

## 2023-07-02 VITALS — BP 118/79 | HR 83 | Ht 60.0 in | Wt 158.1 lb

## 2023-07-02 DIAGNOSIS — I10 Essential (primary) hypertension: Secondary | ICD-10-CM | POA: Diagnosis not present

## 2023-07-02 DIAGNOSIS — R109 Unspecified abdominal pain: Secondary | ICD-10-CM | POA: Diagnosis not present

## 2023-07-02 DIAGNOSIS — F411 Generalized anxiety disorder: Secondary | ICD-10-CM

## 2023-07-02 DIAGNOSIS — R140 Abdominal distension (gaseous): Secondary | ICD-10-CM | POA: Insufficient documentation

## 2023-07-02 DIAGNOSIS — R41840 Attention and concentration deficit: Secondary | ICD-10-CM | POA: Diagnosis not present

## 2023-07-02 LAB — POCT URINALYSIS DIP (CLINITEK)
Bilirubin, UA: NEGATIVE
Blood, UA: NEGATIVE
Glucose, UA: NEGATIVE mg/dL
Ketones, POC UA: NEGATIVE mg/dL
Leukocytes, UA: NEGATIVE
Nitrite, UA: NEGATIVE
POC PROTEIN,UA: NEGATIVE
Spec Grav, UA: 1.025 (ref 1.010–1.025)
Urobilinogen, UA: 0.2 E.U./dL
pH, UA: 5.5 (ref 5.0–8.0)

## 2023-07-02 MED ORDER — CYCLOBENZAPRINE HCL 5 MG PO TABS: 5.0000 mg | ORAL_TABLET | Freq: Three times a day (TID) | ORAL | 0 refills | Status: AC | PRN

## 2023-07-02 MED ORDER — DICYCLOMINE HCL 10 MG PO CAPS
10.0000 mg | ORAL_CAPSULE | Freq: Three times a day (TID) | ORAL | 0 refills | Status: DC
Start: 2023-07-02 — End: 2023-07-09

## 2023-07-02 MED ORDER — METHYLPREDNISOLONE 4 MG PO TBPK: ORAL_TABLET | ORAL | 0 refills | Status: AC

## 2023-07-02 NOTE — Progress Notes (Signed)
Established patient visit   Patient: Emily [name]   DOB: 1986/11/07   37 y.o. Female  MRN: [id number] Visit Date: 07/02/2023  Today's healthcare provider: Charlton Amor, DO   Chief Complaint  Patient presents with   Hypertension    SUBJECTIVE    Chief Complaint  Patient presents with   Hypertension   HPI  Pt presents for HTN follow up. She is currently on losartan 100mg . BP is well controlled at 118/79.   Panic attacks - was previously on lexapro and zoloft both of which did not work - she was tapered off lexapro and is doing well - she was given buspar 5mg  to use for panic attacks  Back Pain - pt said she just started having severe back pain  - pain is located in right flank area   Bloating - pt is having abdominal pain and bloating  - denies constipation and diarrhea.   Review of Systems  Constitutional:  Negative for activity change, fatigue and fever.  Respiratory:  Negative for cough and shortness of breath.   Cardiovascular:  Negative for chest pain.  Gastrointestinal:  Negative for abdominal pain.  Genitourinary:  Negative for difficulty urinating.       Current Meds  Medication Sig   cyclobenzaprine (FLEXERIL) 5 MG tablet Take 1 tablet (5 mg total) by mouth 3 (three) times daily as needed for muscle spasms.   dicyclomine (BENTYL) 10 MG capsule Take 1 capsule (10 mg total) by mouth 4 (four) times daily -  before meals and at bedtime.   methylPREDNISolone (MEDROL DOSEPAK) 4 MG TBPK tablet Follow instructions per pack    OBJECTIVE    BP 118/79   Pulse 83   Ht 5' (1.524 m)   Wt 158 lb 1.9 oz (71.7 kg)   SpO2 96%   BMI 30.88 kg/m   Physical Exam Vitals and nursing note reviewed.  Constitutional:      General: She is not in acute distress.    Appearance: Normal appearance.  HENT:     Head: Normocephalic and atraumatic.     Right Ear: External ear normal.     Left Ear: External ear normal.     Nose: Nose normal.  Eyes:      Conjunctiva/sclera: Conjunctivae normal.  Cardiovascular:     Rate and Rhythm: Normal rate and regular rhythm.  Pulmonary:     Effort: Pulmonary effort is normal.     Breath sounds: Normal breath sounds.  Abdominal:     General: Abdomen is flat. Bowel sounds are normal.     Palpations: Abdomen is soft.  Neurological:     General: No focal deficit present.     Mental Status: She is alert and oriented to person, place, and time.  Psychiatric:        Mood and Affect: Mood normal.        Behavior: Behavior normal.        Thought Content: Thought content normal.        Judgment: Judgment normal.          ASSESSMENT & PLAN    Problem List Items Addressed This Visit       Cardiovascular and Mediastinum   Primary hypertension - Primary    - BP very well controlled today. Pt doing very good job with diet  - continue losartan 100mg         Other   Generalized anxiety disorder    - pt said she would  like a new person for therapy- prefers a female - says he panic attacks have decreased and she hasn't had one in 6 years      Relevant Orders   Ambulatory referral to Behavioral Health   Concentration deficit   Relevant Orders   Ambulatory referral to Psychology   Flank pain    - pt has right sided flank pain. Will order KUB stat to assess for renal calculi  - ua ordered to assess for cystitis as a cause of flank pain and assess for protein in urine - pt unable to give urine sample in clinic will go ahead and give pt sterile cup to take home and return to clinic - differential also includes muscle spasm-given flexiril and medrol dose pack to decrease inflammation      Relevant Orders   DG Abd 1 View (Completed)   POCT URINALYSIS DIP (CLINITEK) (Completed)   Urine Culture   Bloating   Relevant Medications   dicyclomine (BENTYL) 10 MG capsule   Other Relevant Orders   Ambulatory referral to Gastroenterology    Return in about 3 months (around 10/02/2023).      Meds  ordered this encounter  Medications   dicyclomine (BENTYL) 10 MG capsule    Sig: Take 1 capsule (10 mg total) by mouth 4 (four) times daily -  before meals and at bedtime.    Dispense:  60 capsule    Refill:  0   methylPREDNISolone (MEDROL DOSEPAK) 4 MG TBPK tablet    Sig: Follow instructions per pack    Dispense:  21 tablet    Refill:  0   cyclobenzaprine (FLEXERIL) 5 MG tablet    Sig: Take 1 tablet (5 mg total) by mouth 3 (three) times daily as needed for muscle spasms.    Dispense:  15 tablet    Refill:  0    Orders Placed This Encounter  Procedures   Urine Culture   DG Abd 1 View    Standing Status:   Future    Number of Occurrences:   1    Standing Expiration Date:   07/01/2024    Order Specific Question:   Reason for Exam (SYMPTOM  OR DIAGNOSIS REQUIRED)    Answer:   right sided flank pain    Order Specific Question:   Is patient pregnant?    Answer:   No    Order Specific Question:   Preferred imaging location?    Answer:   [facility]   Ambulatory referral to Behavioral Health    Referral Priority:   Routine    Referral Type:   Psychiatric    Referral Reason:   Specialty Services Required    Requested Specialty:   Behavioral Health    Number of Visits Requested:   1   Ambulatory referral to Psychology    Referral Priority:   Routine    Referral Type:   Psychiatric    Referral Reason:   Specialty Services Required    Requested Specialty:   Psychology    Number of Visits Requested:   1   Ambulatory referral to Gastroenterology    Referral Priority:   Routine    Referral Type:   Consultation    Referral Reason:   Specialty Services Required    Number of Visits Requested:   1   POCT URINALYSIS DIP (CLINITEK)     Charlton Amor, DO  [facility] at [facility] [phone] (phone) [phone] (fax)  [facility]

## 2023-07-02 NOTE — Assessment & Plan Note (Signed)
-   pt said she would like a new person for therapy- prefers a female - says he panic attacks have decreased and she hasn't had one in 6 years

## 2023-07-02 NOTE — Assessment & Plan Note (Signed)
-   pt has right sided flank pain. Will order KUB stat to assess for renal calculi  - ua ordered to assess for cystitis as a cause of flank pain and assess for protein in urine - pt unable to give urine sample in clinic will go ahead and give pt sterile cup to take home and return to clinic - differential also includes muscle spasm-given flexiril and medrol dose pack to decrease inflammation

## 2023-07-02 NOTE — Assessment & Plan Note (Signed)
-   BP very well controlled today. Pt doing very good job with diet  - continue losartan 100mg

## 2023-07-03 LAB — URINE CULTURE
MICRO NUMBER:: 15200427
SPECIMEN QUALITY:: ADEQUATE

## 2023-07-04 ENCOUNTER — Other Ambulatory Visit: Payer: Self-pay | Admitting: Family Medicine

## 2023-07-06 ENCOUNTER — Encounter: Payer: Self-pay | Admitting: Family Medicine

## 2023-07-09 ENCOUNTER — Other Ambulatory Visit: Payer: Self-pay | Admitting: Family Medicine

## 2023-07-09 DIAGNOSIS — R140 Abdominal distension (gaseous): Secondary | ICD-10-CM

## 2023-08-01 ENCOUNTER — Other Ambulatory Visit: Payer: Self-pay | Admitting: Family Medicine

## 2023-08-01 DIAGNOSIS — R140 Abdominal distension (gaseous): Secondary | ICD-10-CM

## 2023-08-17 ENCOUNTER — Ambulatory Visit: Payer: Medicaid Other | Admitting: Obstetrics and Gynecology

## 2023-08-17 ENCOUNTER — Encounter: Payer: Self-pay | Admitting: Obstetrics and Gynecology

## 2023-08-21 NOTE — Progress Notes (Signed)
Patient did not keep her GYN referral appointment for 08/17/2023.  Cornelia Copa MD Attending [facility] [employer] [facility])

## 2023-09-03 ENCOUNTER — Other Ambulatory Visit: Payer: Self-pay | Admitting: Family Medicine

## 2023-09-28 ENCOUNTER — Other Ambulatory Visit: Payer: Self-pay

## 2023-09-28 ENCOUNTER — Telehealth: Payer: Self-pay | Admitting: Family Medicine

## 2023-09-28 MED ORDER — LOSARTAN POTASSIUM 100 MG PO TABS: 100.0000 mg | ORAL_TABLET | Freq: Every day | ORAL | 1 refills | Status: DC

## 2023-09-28 NOTE — Telephone Encounter (Signed)
Refill sent in today Roselyn Reef, CMA

## 2023-09-28 NOTE — Telephone Encounter (Signed)
Patient called and request refills on LORSARTAN 100mg  please submit to [facility] [address] [place] Phone Number [phone]

## 2023-10-02 ENCOUNTER — Telehealth: Payer: Self-pay | Admitting: Family Medicine

## 2023-10-02 MED ORDER — LOSARTAN POTASSIUM 100 MG PO TABS: 100.0000 mg | ORAL_TABLET | Freq: Every day | ORAL | 0 refills | Status: AC

## 2023-10-02 NOTE — Telephone Encounter (Signed)
Patient called in stating that she needs her losartan (COZAAR) 100 MG tablet sent to a different pharmacy due to her being out of town   [facility] [address] [place], [place] [address] [phone]

## 2023-10-02 NOTE — Telephone Encounter (Signed)
Medication sent. Unable to reach patient. Phone rings and hangs up.

## 2023-10-03 NOTE — Telephone Encounter (Signed)
Attempted call to patient. Phone rang without answer. Could not leave a voicemail message.

## 2023-12-11 IMAGING — US US MFM OB COMP +14 WKS
1 series · 13 of 28 positions shown · non-contrast
Comparison: none

[Series 1: us mfm ob comp +14 wks · 142 acquisitions, 13 frames shown]
[im 6/142]
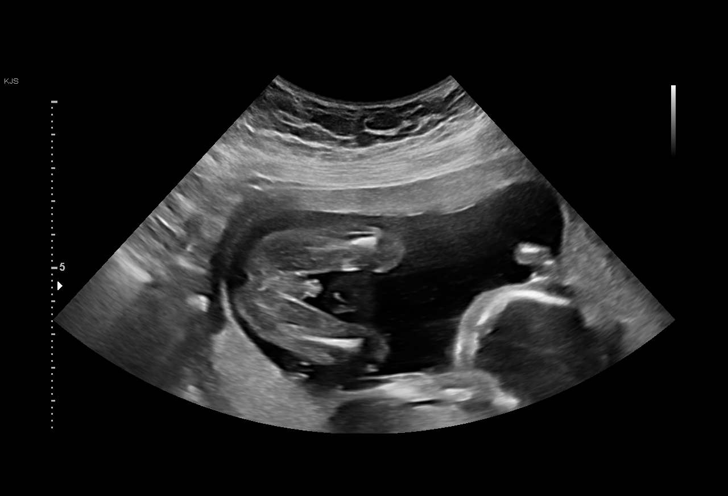
[im 16/142]
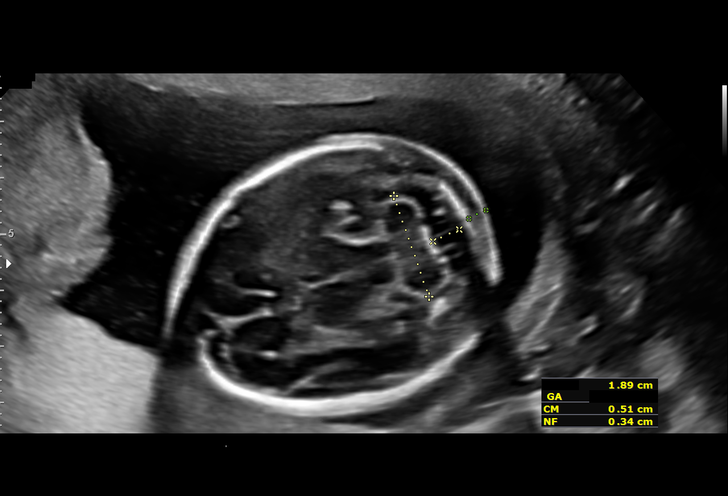
[im 27/142]
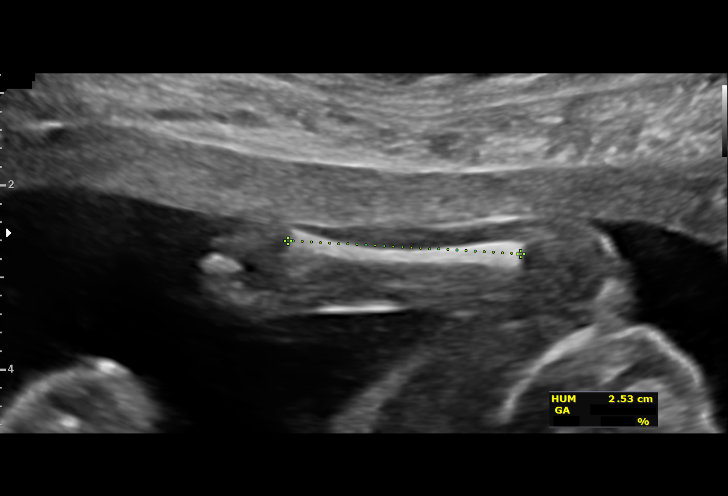
[im 37/142]
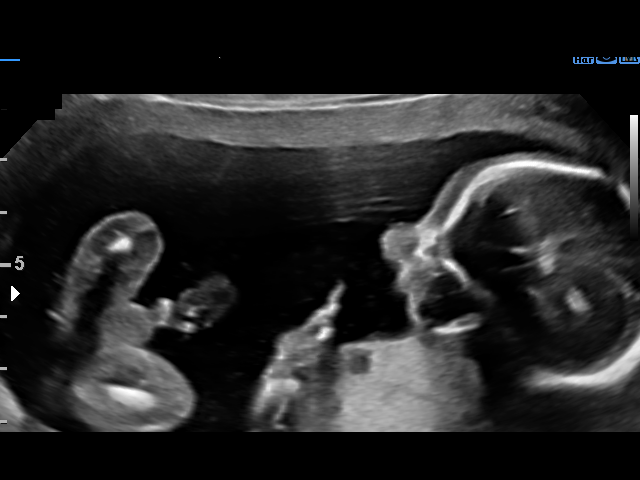
[im 48/142]
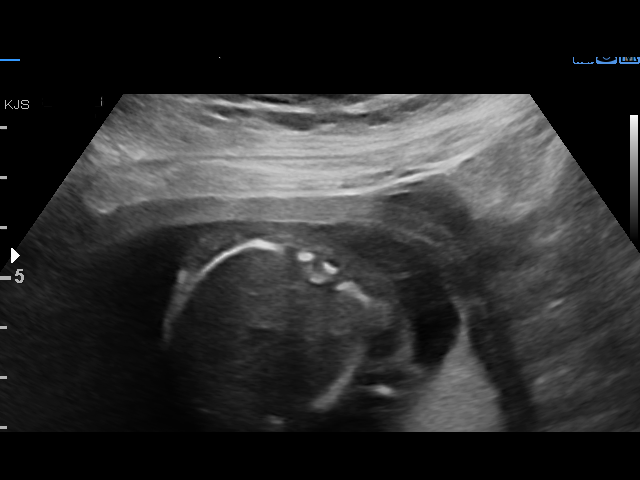
[im 58/142]
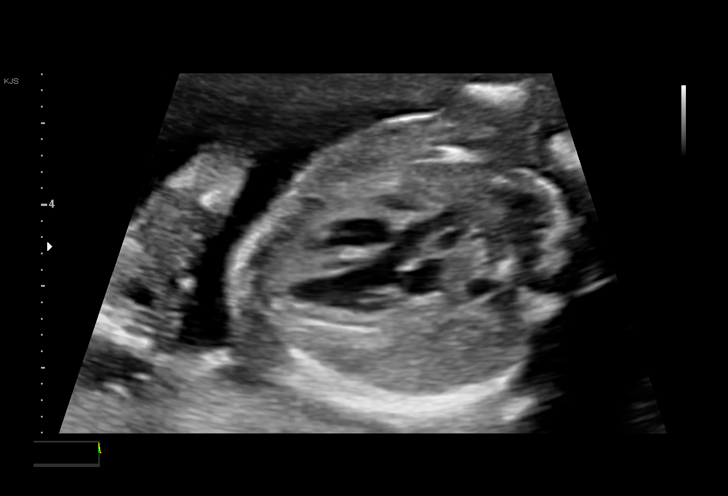
[im 74/142]
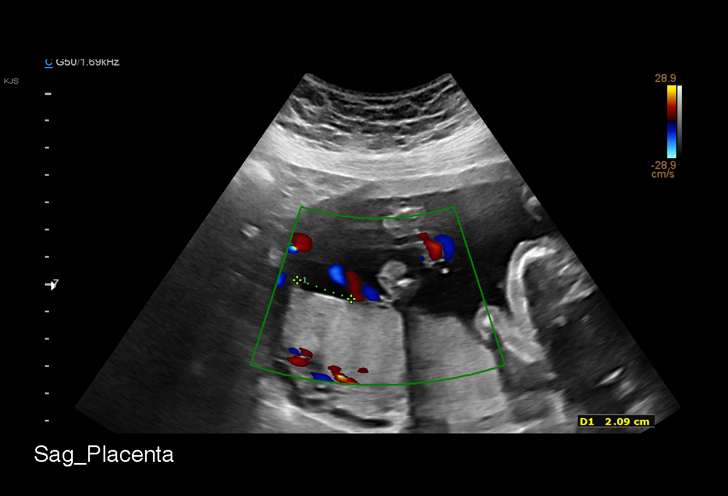
[im 84/142]
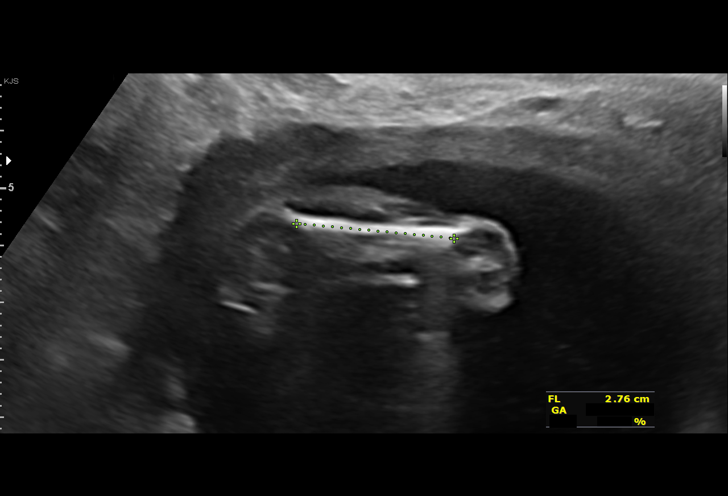
[im 95/142]
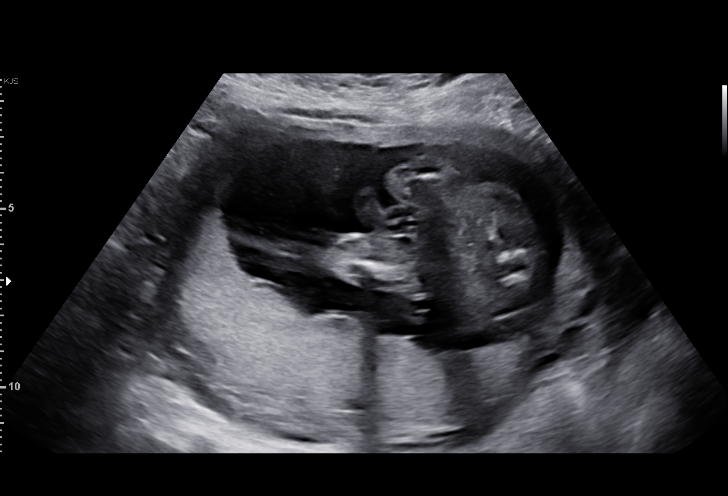
[im 105/142]
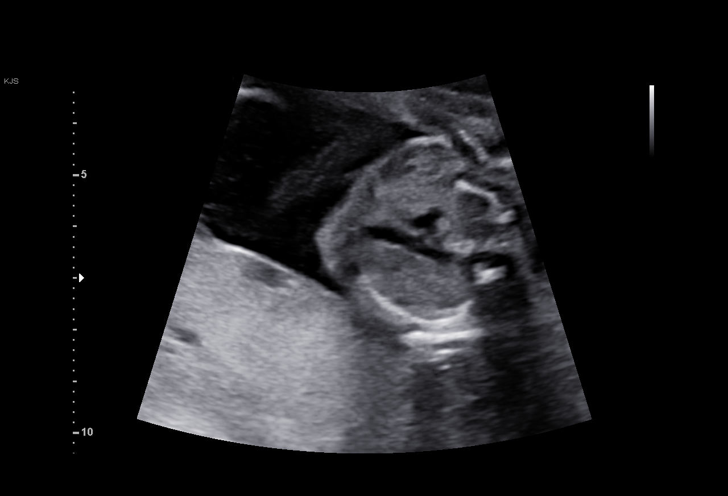
[im 115/142]
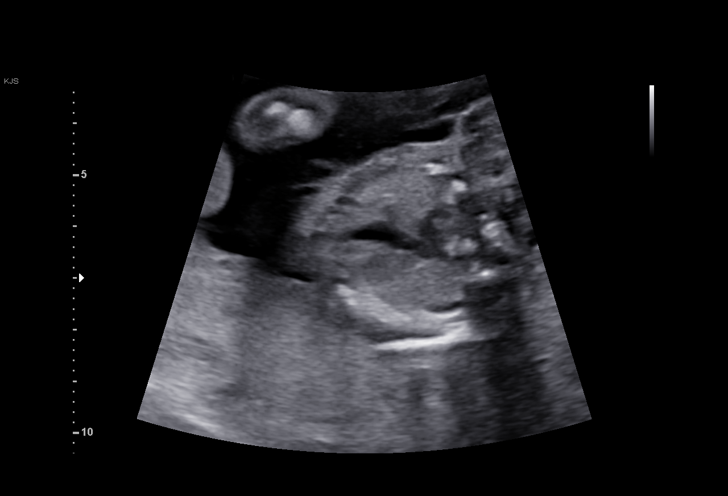
[im 126/142]
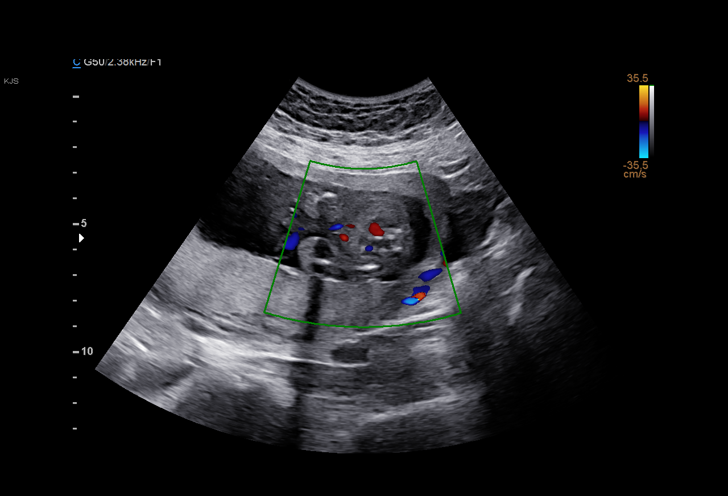
[im 136/142]
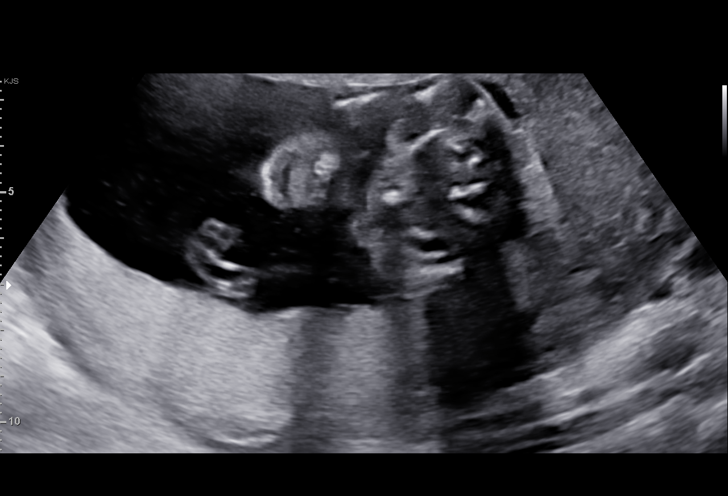

[13 of 28 positions shown; findings below may reference images not displayed]

[facility] [[facility]]
                   NIKIRK CNM

 1  US MFM OB COMP + 14 WK                76805.01    GEISHA ZOE

Indications

 Low lying placenta, antepartum
 Encounter for antenatal screening for
 malformations
 19 weeks gestation of pregnancy
 Declined Genetic Screening
Fetal Evaluation

 Num Of Fetuses:         1
 Fetal Heart Rate(bpm):  155
 Cardiac Activity:       Observed
 Presentation:           Cephalic
 Placenta:               Posterior Low-lying
 P. Cord Insertion:      Not well visualized

 Amniotic Fluid
 AFI FV:      Within normal limits

                             Largest Pocket(cm)

Biometry

 BPD:        44  mm     G. Age:  19w 2d         51  %    CI:        71.35   %    70 - 86
                                                         FL/HC:      16.6   %    16.1 -
 HC:      165.9  mm     G. Age:  19w 2d         43  %    HC/AC:      1.15        1.09 -
 AC:      144.6  mm     G. Age:  19w 5d         62  %    FL/BPD:     62.7   %
 FL:       27.6  mm     G. Age:  18w 3d         16  %    FL/AC:      19.1   %    20 - 24
 HUM:        25  mm     G. Age:  17w 6d         10  %
 CER:      18.9  mm     G. Age:  18w 4d         17  %
 NFT:       3.4  mm

 LV:        5.3  mm
 CM:        5.1  mm

 Est. FW:     278  gm    0 lb 10 oz      39  %
OB History

 Gravidity:    3         Term:   2        Prem:   0        SAB:   0
 TOP:          0       Ectopic:  0        Living: 2
Gestational Age

 LMP:           20w 1d        Date:  09/01/21                 EDD:   06/08/22
 U/S Today:     19w 1d                                        EDD:   06/15/22
 Best:          19w 2d     Det. By:  Early Ultrasound         EDD:   06/14/22
                                     (10/19/21)
Anatomy

 Cranium:               Appears normal         LVOT:                   Not well visualized
 Cavum:                 Appears normal         Aortic Arch:            Not well visualized
 Ventricles:            Appears normal         Ductal Arch:            Not well visualized
 Choroid Plexus:        Appears normal         Diaphragm:              Appears normal
 Cerebellum:            Appears normal         Stomach:                Appears normal, left
                                                                       sided
 Posterior Fossa:       Appears normal         Abdomen:                Appears normal
 Nuchal Fold:           Appears normal         Abdominal Wall:         Appears nml (cord
                                                                       insert, abd wall)
 Face:                  Appears normal         Cord Vessels:           Appears normal (3
                        (orbits and profile)                           vessel cord)
 Lips:                  Appears normal         Bladder:                Appears normal
 Palate:                Not well visualized    Spine:                  Appears normal
 Thoracic:              Appears normal         Upper Extremities:      Appears normal
 Heart:                 Not well visualized    Lower Extremities:      Appears normal
 RVOT:                  Not well visualized

 Other:  Fetus appears to be a male. Parents do not wish to know sex of fetus.
         Nasal bone, lenses, Open hands, 5th digits, feet visualized.
         Technically difficult due to maternal habitus and fetal position.
Cervix Uterus Adnexa

 Cervix
 Length:           3.41  cm.
 Normal appearance by transabdominal scan.

 Uterus
 No abnormality visualized.

 Right Ovary
 No adnexal mass visualized.

 Left Ovary
 No adnexal mass visualized.
 Cul De Sac
 No free fluid seen.

 Adnexa
 No adnexal mass visualized.
Comments

 This patient was seen for a detailed fetal anatomy scan.  The
 father of the baby reports that he had a prior child with a
 different partner who was born with a cleft lip.
 She denies any significant past medical history and denies
 any problems in her current pregnancy.
 She has declined all screening tests for fetal aneuploidy in
 her current pregnancy.
 She was informed that the fetal growth and amniotic fluid
 level were appropriate for her gestational age.
 There were no obvious fetal anomalies noted on today's
 ultrasound exam.  There were no signs of a cleft lip noted
 today.  However, the views of the fetal anatomy were limited
 today due to the fetal position.
 The patient was informed that anomalies may be missed due
 to technical limitations. If the fetus is in a suboptimal position
 or maternal habitus is increased, visualization of the fetus in
 the maternal uterus may be impaired.
 A low-lying placenta was noted on today's exam.  The patient
 was reassured that most cases of placenta previa/low-lying
 placenta will resolve later in her pregnancy.
 A follow-up exam was scheduled in 4 weeks to complete the
 views of the fetal anatomy and to assess the placental
 location.
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
## 2024-04-24 ENCOUNTER — Encounter: Payer: Self-pay | Admitting: Family Medicine
